# Patient Record
Sex: Female | Born: 1955 | Race: White | Hispanic: No | Marital: Married | Smoking: Current every day smoker
Health system: Southern US, Community
[De-identification: ages and names within clinical notes are randomized; demographics above are authoritative.]

## PROBLEM LIST (undated history)

## (undated) DIAGNOSIS — K589 Irritable bowel syndrome without diarrhea: Secondary | ICD-10-CM

## (undated) DIAGNOSIS — J45909 Unspecified asthma, uncomplicated: Secondary | ICD-10-CM

## (undated) DIAGNOSIS — E785 Hyperlipidemia, unspecified: Secondary | ICD-10-CM

## (undated) DIAGNOSIS — K5909 Other constipation: Secondary | ICD-10-CM

## (undated) DIAGNOSIS — Z8711 Personal history of peptic ulcer disease: Secondary | ICD-10-CM

## (undated) DIAGNOSIS — R7303 Prediabetes: Secondary | ICD-10-CM

## (undated) DIAGNOSIS — K279 Peptic ulcer, site unspecified, unspecified as acute or chronic, without hemorrhage or perforation: Secondary | ICD-10-CM

## (undated) DIAGNOSIS — E7800 Pure hypercholesterolemia, unspecified: Secondary | ICD-10-CM

## (undated) DIAGNOSIS — B977 Papillomavirus as the cause of diseases classified elsewhere: Secondary | ICD-10-CM

## (undated) DIAGNOSIS — F419 Anxiety disorder, unspecified: Secondary | ICD-10-CM

## (undated) DIAGNOSIS — N920 Excessive and frequent menstruation with regular cycle: Secondary | ICD-10-CM

## (undated) DIAGNOSIS — J309 Allergic rhinitis, unspecified: Secondary | ICD-10-CM

## (undated) DIAGNOSIS — F17200 Nicotine dependence, unspecified, uncomplicated: Secondary | ICD-10-CM

## (undated) DIAGNOSIS — IMO0002 Reserved for concepts with insufficient information to code with codable children: Secondary | ICD-10-CM

## (undated) DIAGNOSIS — G4700 Insomnia, unspecified: Secondary | ICD-10-CM

## (undated) DIAGNOSIS — M1990 Unspecified osteoarthritis, unspecified site: Secondary | ICD-10-CM

## (undated) DIAGNOSIS — D219 Benign neoplasm of connective and other soft tissue, unspecified: Secondary | ICD-10-CM

## (undated) DIAGNOSIS — J410 Simple chronic bronchitis: Secondary | ICD-10-CM

## (undated) DIAGNOSIS — N946 Dysmenorrhea, unspecified: Secondary | ICD-10-CM

## (undated) DIAGNOSIS — I10 Essential (primary) hypertension: Secondary | ICD-10-CM

## (undated) DIAGNOSIS — D649 Anemia, unspecified: Secondary | ICD-10-CM

## (undated) DIAGNOSIS — K2970 Gastritis, unspecified, without bleeding: Secondary | ICD-10-CM

## (undated) DIAGNOSIS — T8859XA Other complications of anesthesia, initial encounter: Secondary | ICD-10-CM

## (undated) DIAGNOSIS — M810 Age-related osteoporosis without current pathological fracture: Secondary | ICD-10-CM

## (undated) HISTORY — DX: Benign neoplasm of connective and other soft tissue, unspecified: D21.9

## (undated) HISTORY — DX: Nicotine dependence, unspecified, uncomplicated: F17.200

## (undated) HISTORY — DX: Papillomavirus as the cause of diseases classified elsewhere: B97.7

## (undated) HISTORY — DX: Irritable bowel syndrome, unspecified: K58.9

## (undated) HISTORY — DX: Age-related osteoporosis without current pathological fracture: M81.0

## (undated) HISTORY — PX: ROTATOR CUFF REPAIR: SHX139

## (undated) HISTORY — PX: EYE MUSCLE SURGERY: SHX370

## (undated) HISTORY — DX: Reserved for concepts with insufficient information to code with codable children: IMO0002

## (undated) HISTORY — PX: KNEE ARTHROSCOPY W/ MENISCAL REPAIR: SHX1877

## (undated) HISTORY — PX: FOOT SURGERY: SHX648

## (undated) HISTORY — DX: Peptic ulcer, site unspecified, unspecified as acute or chronic, without hemorrhage or perforation: K27.9

## (undated) HISTORY — DX: Gastritis, unspecified, without bleeding: K29.70

## (undated) HISTORY — DX: Insomnia, unspecified: G47.00

## (undated) HISTORY — DX: Pure hypercholesterolemia, unspecified: E78.00

---

## 1989-03-03 HISTORY — PX: FOOT SURGERY: SHX648

## 1993-10-01 DIAGNOSIS — Z8742 Personal history of other diseases of the female genital tract: Secondary | ICD-10-CM

## 1993-10-01 DIAGNOSIS — IMO0002 Reserved for concepts with insufficient information to code with codable children: Secondary | ICD-10-CM

## 1993-10-01 DIAGNOSIS — R87619 Unspecified abnormal cytological findings in specimens from cervix uteri: Secondary | ICD-10-CM

## 1993-10-01 HISTORY — DX: Reserved for concepts with insufficient information to code with codable children: IMO0002

## 1993-10-01 HISTORY — DX: Unspecified abnormal cytological findings in specimens from cervix uteri: R87.619

## 1993-10-01 HISTORY — DX: Personal history of other diseases of the female genital tract: Z87.42

## 1998-07-25 ENCOUNTER — Other Ambulatory Visit: Admission: RE | Admit: 1998-07-25 | Discharge: 1998-07-25 | Payer: Self-pay | Admitting: *Deleted

## 1998-12-17 ENCOUNTER — Encounter (INDEPENDENT_AMBULATORY_CARE_PROVIDER_SITE_OTHER): Payer: Self-pay

## 1998-12-17 ENCOUNTER — Ambulatory Visit (HOSPITAL_COMMUNITY): Admission: RE | Admit: 1998-12-17 | Discharge: 1998-12-17 | Payer: Self-pay | Admitting: Gastroenterology

## 1999-08-12 ENCOUNTER — Other Ambulatory Visit: Admission: RE | Admit: 1999-08-12 | Discharge: 1999-08-12 | Payer: Self-pay | Admitting: *Deleted

## 2000-01-28 ENCOUNTER — Encounter: Payer: Self-pay | Admitting: Neurosurgery

## 2000-01-28 ENCOUNTER — Inpatient Hospital Stay (HOSPITAL_COMMUNITY): Admission: RE | Admit: 2000-01-28 | Discharge: 2000-01-29 | Payer: Self-pay | Admitting: Neurosurgery

## 2000-01-28 HISTORY — PX: CERVICAL DISCECTOMY: SHX98

## 2000-01-28 HISTORY — PX: ANTERIOR CERVICAL DECOMP/DISCECTOMY FUSION: SHX1161

## 2000-03-10 ENCOUNTER — Encounter: Payer: Self-pay | Admitting: Neurosurgery

## 2000-03-10 ENCOUNTER — Encounter: Admission: RE | Admit: 2000-03-10 | Discharge: 2000-03-10 | Payer: Self-pay | Admitting: Neurosurgery

## 2000-08-24 ENCOUNTER — Other Ambulatory Visit: Admission: RE | Admit: 2000-08-24 | Discharge: 2000-08-24 | Payer: Self-pay | Admitting: *Deleted

## 2001-12-17 ENCOUNTER — Other Ambulatory Visit: Admission: RE | Admit: 2001-12-17 | Discharge: 2001-12-17 | Payer: Self-pay | Admitting: *Deleted

## 2002-12-29 ENCOUNTER — Other Ambulatory Visit: Admission: RE | Admit: 2002-12-29 | Discharge: 2002-12-29 | Payer: Self-pay | Admitting: *Deleted

## 2004-01-17 ENCOUNTER — Other Ambulatory Visit: Admission: RE | Admit: 2004-01-17 | Discharge: 2004-01-17 | Payer: Self-pay | Admitting: *Deleted

## 2005-05-06 ENCOUNTER — Other Ambulatory Visit: Admission: RE | Admit: 2005-05-06 | Discharge: 2005-05-06 | Payer: Self-pay | Admitting: Obstetrics and Gynecology

## 2006-05-08 ENCOUNTER — Other Ambulatory Visit: Admission: RE | Admit: 2006-05-08 | Discharge: 2006-05-08 | Payer: Self-pay | Admitting: Obstetrics and Gynecology

## 2007-03-04 HISTORY — PX: SHOULDER ARTHROSCOPY W/ ROTATOR CUFF REPAIR: SHX2400

## 2007-09-24 ENCOUNTER — Other Ambulatory Visit: Admission: RE | Admit: 2007-09-24 | Discharge: 2007-09-24 | Payer: Self-pay | Admitting: Obstetrics and Gynecology

## 2010-03-03 HISTORY — PX: TRIGGER FINGER RELEASE: SHX641

## 2010-04-04 ENCOUNTER — Ambulatory Visit (HOSPITAL_BASED_OUTPATIENT_CLINIC_OR_DEPARTMENT_OTHER)
Admission: RE | Admit: 2010-04-04 | Discharge: 2010-04-04 | Disposition: A | Discharge status: Home / Self Care | Payer: BC Managed Care – PPO | Source: Ambulatory Visit | Attending: Orthopedic Surgery | Admitting: Orthopedic Surgery

## 2010-04-04 DIAGNOSIS — M65839 Other synovitis and tenosynovitis, unspecified forearm: Secondary | ICD-10-CM | POA: Insufficient documentation

## 2010-04-04 HISTORY — PX: TRIGGER FINGER RELEASE: SHX641

## 2010-04-18 NOTE — Op Note (Signed)
  [name], Angela West                 ACCOUNT NO.:  [id number]  MEDICAL RECORD NO.:  [id number]          PATIENT TYPE:  LOCATION:                                 FACILITY:  PHYSICIAN:  Katy Fitch. Necola Bluestein, M.D.      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  OPERATION:  Release of right thumb A1 pulley.  OPERATING SURGEON:  Katy Fitch. Angell Honse, MD  ASSISTANT:  Marveen Reeks Dasnoit, PA-C  ANESTHESIA:  2% lidocaine metacarpal head level block right thumb, this was performed as a minor operating room procedure.  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 Lunette Stands for evaluation and management of a locked right trigger thumb.Dr. Charlett Blake had seen [name] for evaluation of this predicament and tried a steroid injection.  Unfortunately, this was unsuccessful.  Dr. Charlett Blake then referred [name] for hand surgery consult for release of A1 pulley.  After informed consent, she is brought to the operative room at this time anticipating release of the A1 pulley of her right thumb under local anesthesia.  [name] was brought to room 2 of the [facility] and placed in a supine position on the operating table.  After routine surgical time-out, the thumb was prepped with Betadine and 2% lidocaine was infiltrated at the metacarpal head level to obtain a digital block.  After 5 minutes, excellent anesthesia was achieved.  Arm and hand were then prepped with Betadine soap solution, sterilely draped.  On exsanguination of the limb by direct compression, the arterial tourniquet at the proximal brachium was inflated to 220 mmHg. Procedure commenced with a short transverse incision  directly over the palpably thickened A1 pulley.  Subcutaneous tissues were carefully divided taking care to gently identify and retract the radial proper digital nerve.  The pulley was cleared of soft tissues with a Freer followed by placement of Ragnail retractors.  The pulley was split with scalpel scissors.  The tendon was noted have very minor fraying.  After release of the pulley, full active range of motion of the IP joint was recovered.  Bleeding points were not problematic.  The wounds were repaired with two mattress sutures of 5-0 nylon.  [name] was placed in a compressive dressing with sterile gauze and Ace wrap.  She was provided Vicodin 5 mg one p.o. q.4-6 hours p.r.n. pain, 16 tablets without refill.  I will see her back for followup in approximately 1 week for suture removal and initiation of exercise program.     Katy Fitch. Vanilla Heatherington, M.D.     RVS/MEDQ  D:  04/04/2010  T:  04/05/2010  Job:  [id number]  Electronically Signed by Josephine Igo M.D. on 04/18/2010 08:32:03 AM

## 2010-07-19 NOTE — Op Note (Signed)
[facility]  Patient:    Angela West, Angela West                          MRN: [id number] Proc. Date: 01/28/00 Adm. 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  PROCEDURE:  Anterior cervical diskectomy and fusion C5-6 with allograft anterior cervical plate and tong traction.  SURGEON:  Clydene Fake, M.D.  ASSISTANT:  Izell Maeser. Elesa Hacker, M.D.  ANESTHESIA:  General endotracheal anesthesia.  ESTIMATED BLOOD LOSS:  Minimal.  BLOOD GIVEN:  None.  DRAINS:  None.  COMPLICATIONS:  None.  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  MRI was done showing right-sided 5-6 disk herniation compressing the C6 root. Patient brought in for surgery.  PROCEDURE IN DETAIL:  Patient brought in the operating room, general anesthesia was induced.  Patient was placed in Gardner-Wells tong for traction later in the case.  Patient was then prepped and draped in a sterile fashion. Site of incision was injected with 6 cc of 1% lidocaine with epinephrine.  An incision was then made from the midline to the anterior border of the sternocleidomastoid in a skin crease on the left side of the neck.  Incision was taken down through the fat to the platysma and hemostasis obtained with Bovie cauterization.  The platysma was incised with the Bovie and then blunt dissection was taken out through the cervical fascia to the anterior cervical spine.  Needle was placed in the disk space, x-ray obtained showing this was the 5-6 interspace.  The disk space was then incised with  a 15-blade and partial diskectomy performed with pituitary rongeurs.  Longus colli was then reflected laterally on each side using the Bovie and a self-retaining retractor system was then placed.  Diskectomy was then performed using 15-blade and pituitary rongeurs. A Cloward drill guide was placed over the interspace and 10 mm Cloward drill was used to drill through the interspace and endplates leaving a thin rim of posterior cortex.  At this point, the microscope was brought into the field.  Using 1 and 2 mm Kerrison punches, the thin rim of posterior cortex along with the posterior longitudinal ligament was removed.  Then extensive foraminotomies were performed bilaterally using the 1 and 2 mm Kerrison punches and then using the microhook, we were able to remove two pieces of free fragment disk that was out lateral over the C6 root.  After this, there was no pressure on the roots on the right side.  We also made sure there was no pressure on the left foraminotomy.  Wound was irrigated with antibiotic solution and the depth of vertebral body was measured with a depth gauge and a 12 mm bone dowel was cut to be a couple of mm shorter in length.  With traction on the Gardner-Wells tongs, the bone dowel was tapped into place.  We then checked beneath the graft to make sure there was room between the end  of the graft and the dura and there was.  Microscope was removed from the case at this point and an 18 mm anterior cervical plate was placed over the interspace and two screws placed in the C5 and two in the C6. X-ray was obtained showing good position of plate, screws, and bone graft. Wound was irrigated with antibiotic solution, retractor system was removed, hemostasis was obtained with bipolar cauterization and Gelfoam and thrombin. The Gelfoam was then irrigated out and the platysma was closed with 3-0 Vicryl interrupted sutures, the subcutaneous tissues were closed with the same and the  skin closed with Durabond skin glue.  After this was dry, dressing was placed, anterior cervical collar was placed and the patient was then awoken from anesthesia and transferred to recovery room in stable condition. DD:  01/28/00 TD:  01/28/00 Job: [id number]

## 2010-12-02 DIAGNOSIS — D259 Leiomyoma of uterus, unspecified: Secondary | ICD-10-CM

## 2010-12-02 DIAGNOSIS — D219 Benign neoplasm of connective and other soft tissue, unspecified: Secondary | ICD-10-CM

## 2010-12-02 HISTORY — DX: Benign neoplasm of connective and other soft tissue, unspecified: D21.9

## 2010-12-02 HISTORY — DX: Leiomyoma of uterus, unspecified: D25.9

## 2010-12-02 HISTORY — PX: ENDOMETRIAL BIOPSY: SHX622

## 2011-01-21 DIAGNOSIS — F17200 Nicotine dependence, unspecified, uncomplicated: Secondary | ICD-10-CM | POA: Insufficient documentation

## 2012-05-21 ENCOUNTER — Other Ambulatory Visit: Payer: Self-pay | Admitting: Obstetrics & Gynecology

## 2012-08-07 DIAGNOSIS — J45909 Unspecified asthma, uncomplicated: Secondary | ICD-10-CM | POA: Insufficient documentation

## 2013-01-06 ENCOUNTER — Encounter: Payer: Self-pay | Admitting: Nurse Practitioner

## 2013-01-06 ENCOUNTER — Ambulatory Visit (INDEPENDENT_AMBULATORY_CARE_PROVIDER_SITE_OTHER): Payer: BC Managed Care – PPO | Admitting: Nurse Practitioner

## 2013-01-06 VITALS — BP 120/82 | HR 88 | Resp 20 | Ht 60.0 in | Wt 170.0 lb

## 2013-01-06 DIAGNOSIS — Z0000 Encounter for general adult medical examination without abnormal findings: Secondary | ICD-10-CM

## 2013-01-06 DIAGNOSIS — Z01419 Encounter for gynecological examination (general) (routine) without abnormal findings: Secondary | ICD-10-CM

## 2013-01-06 LAB — COMPREHENSIVE METABOLIC PANEL
AST: 21 U/L (ref 0–37)
Albumin: 4.4 g/dL (ref 3.5–5.2)
BUN: 8 mg/dL (ref 6–23)
CO2: 29 mEq/L (ref 19–32)
Calcium: 10.4 mg/dL (ref 8.4–10.5)
Chloride: 106 mEq/L (ref 96–112)
Creat: 0.75 mg/dL (ref 0.50–1.10)
Glucose, Bld: 95 mg/dL (ref 70–99)
Potassium: 5.2 mEq/L (ref 3.5–5.3)
Sodium: 142 mEq/L (ref 135–145)
Total Protein: 7 g/dL (ref 6.0–8.3)

## 2013-01-06 LAB — POCT URINALYSIS DIPSTICK
Bilirubin, UA: NEGATIVE
Glucose, UA: NEGATIVE
Leukocytes, UA: NEGATIVE
Nitrite, UA: NEGATIVE

## 2013-01-06 LAB — TSH: TSH: 1.381 u[IU]/mL (ref 0.350–4.500)

## 2013-01-06 LAB — LIPID PANEL
Cholesterol: 184 mg/dL (ref 0–200)
HDL: 46 mg/dL (ref >39)
Triglycerides: 111 mg/dL (ref <150)

## 2013-01-06 LAB — HEMOGLOBIN, FINGERSTICK: Hemoglobin, fingerstick: 15.7 g/dL (ref 12.0–16.0)

## 2013-01-06 MED ORDER — VITAMIN D (ERGOCALCIFEROL) 1.25 MG (50000 UNIT) PO CAPS: 50000.0000 [IU] | ORAL_CAPSULE | ORAL | Status: DC

## 2013-01-06 NOTE — Patient Instructions (Signed)

## 2013-01-06 NOTE — Progress Notes (Signed)
Patient ID: Angela West, female   DOB: [birth year], 57 y.o.   MRN: [id number] 57 y.o. G8P0A1 Married Caucasian Fe here for annual exam.  Now off Restoril for 4 months and doing well.  No new heath problems.  No LMP recorded. Patient is postmenopausal.          Sexually active: yes  The current method of family planning is post menopausal status.    Exercising: no  The patient does not participate in regular exercise at present. Smoker:  yes  Health Maintenance: Pap:  01/06/12, WNL, neg HR HPV MMG:  01/04/13, normal Colonoscopy:  never BMD:   never TDaP:  ? Labs: HB: 15.7 Urine: negative   reports that she has been smoking.  She has never used smokeless tobacco. She reports that she drinks about 0.5 ounces of alcohol per week. She reports that she does not use illicit drugs.  Past Medical History  Diagnosis Date  . HPV in female   . Smoker   . Abnormal Pap smear 8/95    ASCUS  . IBS (irritable bowel syndrome)   . Hypercholesterolemia   . Fibroids 10/12    numerous  . Insomnia     Past Surgical History  Procedure Laterality Date  . Foot surgery Right   . Cervical discectomy    . Rotator cuff repair Right   . Trigger finger release  2012    right thumb  . Endometrial biopsy  12/2010    negative path    Current Outpatient Prescriptions  Medication Sig Dispense Refill  . ALPRAZolam (XANAX) 1 MG tablet Take 0.5 tablets by mouth as needed.      Marland Kitchen atorvastatin (LIPITOR) 10 MG tablet Take 1 tablet by mouth daily.      . Calcium Carbonate-Vitamin D (CALCIUM + D PO) Take by mouth.      . metoprolol succinate (TOPROL-XL) 25 MG 24 hr tablet Take 1 tablet by mouth daily.      . Multiple Vitamin (MULTIVITAMIN) tablet Take 1 tablet by mouth daily.      . Probiotic Product (PRO-BIOTIC BLEND PO) Take by mouth.      . Vitamin D, Ergocalciferol, (DRISDOL) 50000 UNITS CAPS capsule Take 1 capsule (50,000 Units total) by mouth once a week.  30 capsule  3   No current facility-administered  medications for this visit.    Family History  Problem Relation Age of Onset  . Thyroid disease Mother   . Hypertension Mother   . Hyperlipidemia Mother   . Down syndrome Brother     ROS:  Pertinent items are noted in HPI.  Otherwise, a comprehensive ROS was negative.  Exam:   BP 120/82  Pulse 88  Resp 20  Ht 5' (1.524 m)  Wt 170 lb (77.111 kg)  BMI 33.20 kg/m2 Height: 5' (152.4 cm)  Ht Readings from Last 3 Encounters:  01/06/13 5' (1.524 m)    General appearance: alert, cooperative and appears stated age Head: Normocephalic, without obvious abnormality, atraumatic Neck: no adenopathy, supple, symmetrical, trachea midline and thyroid normal to inspection and palpation Lungs: clear to auscultation bilaterally Breasts: normal appearance, no masses or tenderness Heart: regular rate and rhythm Abdomen: soft, non-tender; no masses,  no organomegaly Extremities: extremities normal, atraumatic, no cyanosis or edema Skin: Skin color, texture, turgor normal. No rashes or lesions Lymph nodes: Cervical, supraclavicular, and axillary nodes normal. No abnormal inguinal nodes palpated Neurologic: Grossly normal   Pelvic: External genitalia:  no lesions  Urethra:  normal appearing urethra with no masses, tenderness or lesions              Bartholin's and Skene's: normal                 Vagina: normal appearing vagina with normal color and discharge, no lesions              Cervix: anteverted              Pap taken: no Bimanual Exam:  Uterus:  normal size, contour, position, consistency, mobility, non-tender              Adnexa: no mass, fullness, tenderness               Rectovaginal: Confirms               Anus:  normal sphincter tone, no lesions  A:  Well Woman with normal exam  Postmenopausal no HRT  Remote history of abnormal pap ASCUS 8/95  History of IBS, left OV cyst, and multiple UTE fibroids  History of PMB 10/12 with negative endo biopsy  Vit D  deficiency   P:   Pap smear as per guidelines   Mammogram due 11/15  Refill on Vit D and will follow with labs  Counseled on breast self exam, adequate intake of calcium and vitamin D, diet and exercise return annually or prn  An After Visit Summary was printed and given to the patient.

## 2013-01-07 LAB — VITAMIN D 25 HYDROXY (VIT D DEFICIENCY, FRACTURES): Vit D, 25-Hydroxy: 53 ng/mL (ref 30–89)

## 2013-01-11 ENCOUNTER — Telehealth: Payer: Self-pay | Admitting: *Deleted

## 2013-01-11 NOTE — Telephone Encounter (Signed)
I have attempted to contact this patient by phone with the following results: left message to return my call on answering machine (home).  

## 2013-01-11 NOTE — Telephone Encounter (Signed)
Message copied by Luisa Dago on Tue Jan 11, 2013 10:46 AM ------      Message from: Ria Comment R      Created: Fri Jan 07, 2013  5:15 PM       Let patient know results.  LDL is slightly up ------

## 2013-01-12 NOTE — Progress Notes (Signed)
Encounter reviewed by Dr. Dannon Nguyenthi Silva.  

## 2013-01-12 NOTE — Telephone Encounter (Signed)
Pt.notified

## 2014-01-02 ENCOUNTER — Encounter: Payer: Self-pay | Admitting: Nurse Practitioner

## 2014-01-30 ENCOUNTER — Ambulatory Visit (INDEPENDENT_AMBULATORY_CARE_PROVIDER_SITE_OTHER): Payer: BC Managed Care – PPO | Admitting: Nurse Practitioner

## 2014-01-30 ENCOUNTER — Encounter: Payer: Self-pay | Admitting: Nurse Practitioner

## 2014-01-30 VITALS — BP 140/80 | HR 78 | Ht 60.5 in | Wt 170.0 lb

## 2014-01-30 DIAGNOSIS — Z1211 Encounter for screening for malignant neoplasm of colon: Secondary | ICD-10-CM

## 2014-01-30 DIAGNOSIS — Z01419 Encounter for gynecological examination (general) (routine) without abnormal findings: Secondary | ICD-10-CM

## 2014-01-30 DIAGNOSIS — E559 Vitamin D deficiency, unspecified: Secondary | ICD-10-CM

## 2014-01-30 DIAGNOSIS — R319 Hematuria, unspecified: Secondary | ICD-10-CM

## 2014-01-30 DIAGNOSIS — Z23 Encounter for immunization: Secondary | ICD-10-CM

## 2014-01-30 MED ORDER — VITAMIN D (ERGOCALCIFEROL) 1.25 MG (50000 UNIT) PO CAPS: 50000.0000 [IU] | ORAL_CAPSULE | ORAL | Status: DC

## 2014-01-30 NOTE — Patient Instructions (Signed)

## 2014-01-30 NOTE — Progress Notes (Signed)
Patient ID: Angela West, female   DOB: [birth year], 58 y.o.   MRN: [id number] 58 y.o. G61P0010 Married Caucasian Fe here for annual exam.  Will get last Vit D level done at [facility] 9/15 and send [place] results. Mother just diagnosed with DCIS at age 46 and had lumpectomy.  Patient's last menstrual period was 12/27/2010.          Sexually active: yes  The current method of family planning is post menopausal status.  Exercising: no The patient does not participate in regular exercise at present. Smoker: yes, 1/2 ppd  Health Maintenance: Pap: 01/06/12, WNL, neg HR HPV MMG: 01/04/13, normal, will schedule for 2015 after discussion at today's visit.  Mother recently diagnosed with breast cancer. Colonoscopy: never BMD: never TDaP: 4/97 Labs:  PCP  Urine:  Trace RBC   reports that she has been smoking.  She has never used smokeless tobacco. She reports that she drinks about 0.5 - 1.0 oz of alcohol per week. She reports that she does not use illicit drugs.  Past Medical History  Diagnosis Date  . HPV in female   . Smoker   . Abnormal Pap smear 8/95    ASCUS  . IBS (irritable bowel syndrome)   . Hypercholesterolemia   . Fibroids 10/12    numerous  . Insomnia     Past Surgical History  Procedure Laterality Date  . Foot surgery Right   . Cervical discectomy    . Rotator cuff repair Right   . Trigger finger release  2012    right thumb  . Endometrial biopsy  12/2010    negative path    Current Outpatient Prescriptions  Medication Sig Dispense Refill  . ALPRAZolam (XANAX) 1 MG tablet Take 0.5 tablets by mouth as needed.    Marland Kitchen atorvastatin (LIPITOR) 10 MG tablet Take 1 tablet by mouth daily.    . Calcium Carbonate-Vitamin D (CALCIUM + D PO) Take by mouth.    . meloxicam (MOBIC) 15 MG tablet Take 1 tablet by mouth daily as needed.    . metoprolol succinate (TOPROL-XL) 25 MG 24 hr tablet Take 2 tablets by mouth daily.     . Multiple Vitamin (MULTIVITAMIN) tablet Take 1 tablet by mouth  daily.    . Probiotic Product (PRO-BIOTIC BLEND PO) Take by mouth.    . Vitamin D, Ergocalciferol, (DRISDOL) 50000 UNITS CAPS capsule Take 1 capsule (50,000 Units total) by mouth once a week. 30 capsule 3   No current facility-administered medications for this visit.    Family History  Problem Relation Age of Onset  . Thyroid disease Mother   . Hypertension Mother   . Hyperlipidemia Mother   . Breast cancer Mother 29  . Down syndrome Brother     ROS:  Pertinent items are noted in HPI.  Otherwise, a comprehensive ROS was negative.  Exam:   BP 140/80 mmHg  Pulse 78  Ht 5' 0.5" (1.537 m)  Wt 170 lb (77.111 kg)  BMI 32.64 kg/m2  LMP 12/27/2010 Height: 5' 0.5" (153.7 cm)  Ht Readings from Last 3 Encounters:  01/30/14 5' 0.5" (1.537 m)  01/06/13 5' (1.524 m)    General appearance: alert, cooperative and appears stated age Head: Normocephalic, without obvious abnormality, atraumatic Neck: no adenopathy, supple, symmetrical, trachea midline and thyroid normal to inspection and palpation Lungs: clear to auscultation bilaterally Breasts: normal appearance, no masses or tenderness Heart: regular rate and rhythm Abdomen: soft, non-tender; no masses,  no organomegaly Extremities: extremities normal,  atraumatic, no cyanosis or edema Skin: Skin color, texture, turgor normal. No rashes or lesions Lymph nodes: Cervical, supraclavicular, and axillary nodes normal. No abnormal inguinal nodes palpated Neurologic: Grossly normal   Pelvic: External genitalia:  no lesions              Urethra:  normal appearing urethra with no masses, tenderness or lesions              Bartholin's and Skene's: normal                 Vagina: normal appearing vagina with normal color and discharge, no lesions              Cervix: anteverted              Pap taken: No. Bimanual Exam:  Uterus:  normal size, contour, position, consistency, mobility, non-tender              Adnexa: no mass, fullness,  tenderness               Rectovaginal: Confirms               Anus:  normal sphincter tone, no lesions  A:  Well Woman with normal exam  Postmenopausal no HRT Remote history of abnormal pap ASCUS 8/95 History of IBS, left OV cyst, and multiple UTE fibroids History of PMB 10/12 with negative endo biopsy Vit D deficiency  Update immunization  Hematuria - asymptomatic   P:   Reviewed health and wellness pertinent to exam  Pap smear not taken today  Mammogram is due and will schedule - after discussion will get 3 D  TDaP today  Refill on Vit d and she will send labs  Follow on urine C&S, micro  Referral to Dr. Collene Mares for screening colonoscopy  Counseled on breast self exam, mammography screening, adequate intake of calcium and vitamin D, diet and exercise, Kegel's exercises return annually or prn  An After Visit Summary was printed and given to the patient.

## 2014-01-30 NOTE — Progress Notes (Signed)
Encounter reviewed by Dr. Brook Silva.  

## 2014-01-31 LAB — URINALYSIS, MICROSCOPIC ONLY
Bacteria, UA: NONE SEEN
CASTS: NONE SEEN
Crystals: NONE SEEN
SQUAMOUS EPITHELIAL / LPF: NONE SEEN

## 2014-02-01 LAB — URINE CULTURE
Colony Count: NO GROWTH
ORGANISM ID, BACTERIA: NO GROWTH

## 2014-02-03 ENCOUNTER — Telehealth: Payer: Self-pay | Admitting: *Deleted

## 2014-02-03 NOTE — Telephone Encounter (Signed)
-----   Message from Milford Cage, [place] sent at 02/02/2014  8:46 AM EST ----- Let patient know that urine culture was negative.

## 2014-02-03 NOTE — Telephone Encounter (Signed)
I have attempted to contact this patient by phone with the following results: left message to return call to [place] at [phone] answering machine (home per [facility]).  No personal information given.  [phone] (Home)  Work number also listed on DPR, but not called.  Phone number did not match number given in demographics.

## 2014-02-06 NOTE — Telephone Encounter (Signed)
Pt informed of results below.

## 2014-02-13 ENCOUNTER — Telehealth: Payer: Self-pay | Admitting: Nurse Practitioner

## 2014-02-13 NOTE — Telephone Encounter (Signed)
Left message for patient to call back. Need to advise that she is scheduled with Dr Collene Mares 01.05.2016 @ 1345.

## 2014-02-15 ENCOUNTER — Telehealth: Payer: Self-pay | Admitting: Nurse Practitioner

## 2014-02-15 NOTE — Telephone Encounter (Signed)
Pt states she is trying to fax her lab results as requested by our office. 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 Instead, they use an email scan to fax system that goes to another persons email. She asked for an email address to send it to, but i notified her that was not an option for our office.   She asked to relay that she didn't think her vitamin D was listed on the labs from the other office.   Please call to discuss  bf

## 2014-02-15 NOTE — Telephone Encounter (Signed)
Advised patient of appt with Dr Collene Mares. Patient will call them directly to reschedule. Provided her with their telephone #.

## 2014-02-16 NOTE — Telephone Encounter (Signed)
Patient does not have vitamin D results. She feels this is what Milford Cage, FNP was waiting for.  Advised can have labs drawn here. Patient requests to wait. She will see her pcp in [place] soon as she would like rx for wellbutrin to help with quitting smoking and will ask them to draw labs. If cannot have vitamin d drawn at pcp will call us back for lab appointment.  Routing to provider for final review. Patient agreeable to disposition. Will close encounter

## 2014-02-28 DIAGNOSIS — E559 Vitamin D deficiency, unspecified: Secondary | ICD-10-CM | POA: Insufficient documentation

## 2014-03-17 LAB — HM COLONOSCOPY: HM COLON: NORMAL

## 2014-06-02 ENCOUNTER — Other Ambulatory Visit: Payer: Self-pay | Admitting: Nurse Practitioner

## 2014-06-05 NOTE — Telephone Encounter (Signed)
Medication refill request: Vitamin D Last AEX:  01/30/14 PG Next AEX: 02/05/15 PG Last MMG (if hormonal medication request): 01/04/13 Normal  Refill authorized: 01/30/14 #30caps/3R.  Patient should have enough refills left till 02/2015

## 2015-02-05 ENCOUNTER — Encounter: Payer: Self-pay | Admitting: Nurse Practitioner

## 2015-02-05 ENCOUNTER — Ambulatory Visit (INDEPENDENT_AMBULATORY_CARE_PROVIDER_SITE_OTHER): Payer: BC Managed Care – PPO | Admitting: Nurse Practitioner

## 2015-02-05 VITALS — BP 108/76 | HR 74 | Ht 60.25 in | Wt 158.0 lb

## 2015-02-05 DIAGNOSIS — E559 Vitamin D deficiency, unspecified: Secondary | ICD-10-CM | POA: Diagnosis not present

## 2015-02-05 DIAGNOSIS — Z0000 Encounter for general adult medical examination without abnormal findings: Secondary | ICD-10-CM | POA: Diagnosis not present

## 2015-02-05 DIAGNOSIS — Z01419 Encounter for gynecological examination (general) (routine) without abnormal findings: Secondary | ICD-10-CM

## 2015-02-05 LAB — POCT URINALYSIS DIPSTICK
BILIRUBIN UA: NEGATIVE
GLUCOSE UA: NEGATIVE
KETONES UA: NEGATIVE
Leukocytes, UA: NEGATIVE
NITRITE UA: NEGATIVE
PH UA: 5
Protein, UA: NEGATIVE
RBC UA: NEGATIVE
Urobilinogen, UA: NEGATIVE

## 2015-02-05 MED ORDER — VITAMIN D (ERGOCALCIFEROL) 1.25 MG (50000 UNIT) PO CAPS: 50000.0000 [IU] | ORAL_CAPSULE | ORAL | Status: DC

## 2015-02-05 NOTE — Patient Instructions (Signed)

## 2015-02-05 NOTE — Progress Notes (Signed)
Patient ID: Angela West, female   DOB: Dec 07, 1955, 59 y.o.   MRN: [id number]  59 y.o. G78P0010 Married  Caucasian Fe here for annual exam.  Recent [facility] including [facility] and was normal.  On [employer] weight loss program and has lost 12 pounds of weight since last year.  Patient's last menstrual period was 12/27/2010.          Sexually active: No.  The current method of family planning is none.    Exercising: Yes.    walking Smoker:  Yes, 1/2 pack per day  Health Maintenance: Pap: 01/06/12, WNL, neg HR HPV MMG:01/10/15, 3D, Bi-Rads 1:  Negative Colonoscopy:03/2014, Normal, repeat in 10 years, Dr. Collene Mares BMD: never TDaP:01/30/14 Labs:PCP   reports that she has been smoking.  She has never used smokeless tobacco. She reports that she drinks about 0.5 - 1.0 oz of alcohol per week. She reports that she does not use illicit drugs.  Past Medical History  Diagnosis Date  . HPV in female   . Smoker   . Abnormal Pap smear 8/95    ASCUS  . IBS (irritable bowel syndrome)   . Hypercholesterolemia   . Fibroids 10/12    numerous  . Insomnia     Past Surgical History  Procedure Laterality Date  . Foot surgery Right   . Cervical discectomy  01/28/2000  . Rotator cuff repair Right 2009 or 2010  . Trigger finger release  2012    right thumb  . Endometrial biopsy  12/2010    negative path    Current Outpatient Prescriptions  Medication Sig Dispense Refill  . ALPRAZolam (XANAX) 1 MG tablet Take 0.5 tablets by mouth as needed.    Marland Kitchen atorvastatin (LIPITOR) 10 MG tablet Take 1 tablet by mouth daily.    Marland Kitchen buPROPion (WELLBUTRIN SR) 150 MG 12 hr tablet Take 1 tablet by mouth daily.  0  . Calcium Carbonate-Vitamin D (CALCIUM + D PO) Take by mouth.    . metoprolol succinate (TOPROL-XL) 25 MG 24 hr tablet Take 2 tablets by mouth daily.     . Multiple Vitamin (MULTIVITAMIN) tablet Take 1 tablet by mouth daily.    . Probiotic Product (PRO-BIOTIC BLEND PO) Take  by mouth.    . Vitamin D, Ergocalciferol, (DRISDOL) 50000 UNITS CAPS capsule Take 1 capsule (50,000 Units total) by mouth once a week. 30 capsule 0   No current facility-administered medications for this visit.    Family History  Problem Relation Age of Onset  . Thyroid disease Mother   . Hypertension Mother   . Hyperlipidemia Mother   . Breast cancer Mother 5  . Down syndrome Brother     ROS:  Pertinent items are noted in HPI.  Otherwise, a comprehensive ROS was negative.  Exam:   BP 108/76 mmHg  Pulse 74  Ht 5' 0.25" (1.53 m)  Wt 158 lb (71.668 kg)  BMI 30.62 kg/m2  LMP 12/27/2010 Height: 5' 0.25" (153 cm) Ht Readings from Last 3 Encounters:  02/05/15 5' 0.25" (1.53 m)  01/30/14 5' 0.5" (1.537 m)  01/06/13 5' (1.524 m)    General appearance: alert, cooperative and appears stated age Head: Normocephalic, without obvious abnormality, atraumatic Neck: no adenopathy, supple, symmetrical, trachea midline and thyroid normal to inspection and palpation Lungs: clear to auscultation bilaterally Breasts: normal appearance, no masses or tenderness Heart: regular rate and rhythm Abdomen: soft, non-tender; no masses,  no organomegaly Extremities: extremities normal, atraumatic, no  cyanosis or edema Skin: Skin color, texture, turgor normal. No rashes or lesions Lymph nodes: Cervical, supraclavicular, and axillary nodes normal. No abnormal inguinal nodes palpated Neurologic: Grossly normal   Pelvic: External genitalia:  no lesions              Urethra:  normal appearing urethra with no masses, tenderness or lesions              Bartholin's and Skene's: normal                 Vagina: normal appearing vagina with normal color and discharge, no lesions              Cervix: anteverted              Pap taken: Yes.   Bimanual Exam:  Uterus:  normal size, contour, position, consistency, mobility, non-tender              Adnexa: no mass, fullness, tenderness                Rectovaginal: Confirms               Anus:  normal sphincter tone, no lesions  Chaperone present: yes  A:  Well Woman with normal exam  Postmenopausal no HRT Remote history of abnormal pap ASCUS 8/95 History of IBS, left OV cyst, and multiple UTE fibroids History of PMB 10/12 with negative endo biopsy Vit D deficiency - now at every 2 weeks   P:   Reviewed health and wellness pertinent to exam  Pap smear as above  Mammogram is due 11/17  Refill Vit D for a year  Will follow with labs  Counseled on breast self exam, mammography screening, adequate intake of calcium and vitamin D, diet and exercise return annually or prn  An After Visit Summary was printed and given to the patient.

## 2015-02-06 LAB — HIV ANTIBODY (ROUTINE TESTING W REFLEX): HIV: NONREACTIVE

## 2015-02-06 LAB — HEPATITIS C ANTIBODY: HCV Ab: NEGATIVE

## 2015-02-07 NOTE — Progress Notes (Signed)
Encounter reviewed Jill Jertson, MD   

## 2015-02-09 LAB — IPS PAP TEST WITH HPV

## 2016-02-06 ENCOUNTER — Encounter: Payer: Self-pay | Admitting: Nurse Practitioner

## 2016-02-06 ENCOUNTER — Ambulatory Visit (INDEPENDENT_AMBULATORY_CARE_PROVIDER_SITE_OTHER): Payer: BC Managed Care – PPO | Admitting: Nurse Practitioner

## 2016-02-06 VITALS — BP 130/80 | HR 64 | Ht 60.0 in | Wt 180.0 lb

## 2016-02-06 DIAGNOSIS — E559 Vitamin D deficiency, unspecified: Secondary | ICD-10-CM | POA: Diagnosis not present

## 2016-02-06 DIAGNOSIS — Z0000 Encounter for general adult medical examination without abnormal findings: Secondary | ICD-10-CM | POA: Diagnosis not present

## 2016-02-06 DIAGNOSIS — Z01419 Encounter for gynecological examination (general) (routine) without abnormal findings: Secondary | ICD-10-CM | POA: Diagnosis not present

## 2016-02-06 MED ORDER — VITAMIN D (ERGOCALCIFEROL) 1.25 MG (50000 UNIT) PO CAPS: 50000.0000 [IU] | ORAL_CAPSULE | ORAL | 2 refills | Status: DC

## 2016-02-06 NOTE — Progress Notes (Signed)
Patient ID: Angela West, female   DOB: 14-Mar-1955, 60 y.o.   MRN: [id number]  60 y.o. G1P0010 Married  Caucasian Fe here for annual exam.  Stopped Nutra system due to GB distress.   No new problems. Now seeing new PCP.  Patient's last menstrual period was 12/27/2010 (exact date).          Sexually active: No.  The current method of family planning is none and post menopausal status.    Exercising: Yes.    walking Smoker:  no  Health Maintenance: Pap:  02/05/15, negative with neg HR HPV MMG:  01/10/15, 3D, Bi-Rads 1:  Negative  Colonoscopy:  03/2014, Normal, repeat in 10 years BMD:   Never TDaP: 01/30/14 Shingles: Not indicated due to age Pneumonia: Not indicated due to age Hep C and HIV: 02/05/15 Labs: PCP takes care of all labs   reports that she has been smoking.  She has never used smokeless tobacco. She reports that she drinks about 0.5 - 1.0 oz of alcohol per week . She reports that she does not use drugs.  Past Medical History:  Diagnosis Date  . Abnormal Pap smear 8/95   ASCUS  . Fibroids 10/12   numerous  . HPV in female   . Hypercholesterolemia   . IBS (irritable bowel syndrome)   . Insomnia   . Smoker     Past Surgical History:  Procedure Laterality Date  . CERVICAL DISCECTOMY  01/28/2000  . ENDOMETRIAL BIOPSY  12/2010   negative path  . FOOT SURGERY Right   . ROTATOR CUFF REPAIR Right 2009 or 2010  . TRIGGER FINGER RELEASE  2012   right thumb    Current Outpatient Prescriptions  Medication Sig Dispense Refill  . atorvastatin (LIPITOR) 10 MG tablet Take 1 tablet by mouth daily.    . Calcium Carbonate-Vitamin D (CALCIUM + D PO) Take by mouth.    . metoprolol succinate (TOPROL-XL) 50 MG 24 hr tablet Take 1 tablet by mouth daily.    . Multiple Vitamin (MULTIVITAMIN) tablet Take 1 tablet by mouth daily.    . Probiotic Product (PRO-BIOTIC BLEND PO) Take by mouth.    . Vitamin D, Ergocalciferol, (DRISDOL) 50000 UNITS CAPS capsule Take 1 capsule (50,000 Units total)  by mouth once a week. 30 capsule 0   No current facility-administered medications for this visit.     Family History  Problem Relation Age of Onset  . Thyroid disease Mother   . Hypertension Mother   . Hyperlipidemia Mother   . Breast cancer Mother 55  . Down syndrome Brother     ROS:  Pertinent items are noted in HPI.  Otherwise, a comprehensive ROS was negative.  Exam:   BP 130/80 (BP Location: Left Arm, Patient Position: Sitting, Cuff Size: Large)   Pulse 64   Ht 5' (1.524 m)   Wt 180 lb (81.6 kg)   LMP 12/27/2010 (Exact Date)   BMI 35.15 kg/m  Height: 5' (152.4 cm) Ht Readings from Last 3 Encounters:  02/06/16 5' (1.524 m)  02/05/15 5' 0.25" (1.53 m)  01/30/14 5' 0.5" (1.537 m)    General appearance: alert, cooperative and appears stated age Head: Normocephalic, without obvious abnormality, atraumatic Neck: no adenopathy, supple, symmetrical, trachea midline and thyroid normal to inspection and palpation Lungs: clear to auscultation bilaterally Breasts: normal appearance, no masses or tenderness Heart: regular rate and rhythm Abdomen: soft, non-tender; no masses,  no organomegaly Extremities: extremities normal, atraumatic, no cyanosis or edema Skin: Skin  color, texture, turgor normal. No rashes or lesions Lymph nodes: Cervical, supraclavicular, and axillary nodes normal. No abnormal inguinal nodes palpated Neurologic: Grossly normal   Pelvic: External genitalia:  no lesions              Urethra:  normal appearing urethra with no masses, tenderness or lesions              Bartholin's and Skene's: normal                 Vagina: normal appearing vagina with normal color and discharge, no lesions              Cervix: anteverted              Pap taken: No. Bimanual Exam:  Uterus:  normal size, contour, position, consistency, mobility, non-tender              Adnexa: no mass, fullness, tenderness               Rectovaginal: Confirms               Anus:  normal  sphincter tone, no lesions  Chaperone present: yes  A:  Well Woman with normal exam  Postmenopausal no HRT Remote history of abnormal pap ASCUS 8/95 History of IBS, left OV cyst, and multiple UTE fibroids History of PMB 10/12 with negative endo biopsy Vit D deficiency - now at every 2 weeks   P:   Reviewed health and wellness pertinent to exam  Pap smear as above  Mammogram is due and will schedule  Refill on Vit D and will follow with labs  Counseled on breast self exam, mammography screening, adequate intake of calcium and vitamin D, diet and exercise, Kegel's exercises return annually or prn  An After Visit Summary was printed and given to the patient.

## 2016-02-06 NOTE — Patient Instructions (Signed)

## 2016-02-07 LAB — VITAMIN D 25 HYDROXY (VIT D DEFICIENCY, FRACTURES): Vit D, 25-Hydroxy: 33 ng/mL (ref 30–100)

## 2016-02-08 ENCOUNTER — Telehealth: Payer: Self-pay

## 2016-02-08 NOTE — Progress Notes (Signed)
Encounter reviewed by Dr. Brook Amundson C. Silva.  

## 2016-02-08 NOTE — Telephone Encounter (Signed)
-----   Message from Graylon Good, [place] sent at 02/08/2016 11:07 AM EST -----   ----- Message ----- From: Kem Boroughs, FNP Sent: 02/08/2016   8:43 AM To: Graylon Good, CMA  Please let pt know that Vit D was lower at 33 compared to last year at 51.  She does miss some doses of her Vit D - encourage compliance. Our goal for her is about 50.

## 2016-02-08 NOTE — Telephone Encounter (Signed)
Left message (per DPR) on vm to call back. -sco

## 2016-02-12 NOTE — Telephone Encounter (Signed)
Left detailed message on vm (per DPR) of lab results. -sco

## 2016-08-22 ENCOUNTER — Encounter: Payer: Self-pay | Admitting: Nurse Practitioner

## 2016-09-26 ENCOUNTER — Telehealth: Payer: Self-pay | Admitting: Obstetrics and Gynecology

## 2016-09-26 NOTE — Telephone Encounter (Signed)
LMTCB/:NP/ .CX/LETTER SENT/RD

## 2017-02-11 ENCOUNTER — Ambulatory Visit: Payer: BC Managed Care – PPO | Admitting: Nurse Practitioner

## 2017-02-19 ENCOUNTER — Ambulatory Visit: Payer: BC Managed Care – PPO | Admitting: Obstetrics and Gynecology

## 2017-02-19 ENCOUNTER — Other Ambulatory Visit: Payer: Self-pay

## 2017-02-19 ENCOUNTER — Encounter: Payer: Self-pay | Admitting: Obstetrics and Gynecology

## 2017-02-19 VITALS — BP 158/80 | HR 84 | Resp 18 | Ht 60.0 in | Wt 182.0 lb

## 2017-02-19 DIAGNOSIS — Z01419 Encounter for gynecological examination (general) (routine) without abnormal findings: Secondary | ICD-10-CM

## 2017-02-19 NOTE — Progress Notes (Signed)
61 y.o. G1P0010 MarriedCaucasianF here for annual exam.  No vaginal bleeding. Not sexually active, h/o ED.  She has gained weight recently. She is only up 2 lbs from last year, but her clothes aren't fitting. She is having issues with her right flexor muscle in her thigh and left ankle.  Normally her BP is fine, stressful driving in the weather.  Retiring after her birthday in May    Patient's last menstrual period was 12/27/2010 (exact date).          Sexually active: No.  The current method of family planning is post menopausal status.    Exercising: No.  The patient does not participate in regular exercise at present. Smoker:  yes  Health Maintenance: Pap:  02-05-15 WNL NEG HR HPV History of abnormal Pap:  no MMG:  07-31-16 WNL  Colonoscopy:  2017 Normal per patient BMD:   Never TDaP:  2015 Gardasil: N/A   reports that she has been smoking.  She has been smoking about 0.50 packs per day. she has never used smokeless tobacco. She reports that she drinks about 1.8 - 2.4 oz of alcohol per week. She reports that she does not use drugs. 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   Past Medical History:  Diagnosis Date  . Abnormal Pap smear 8/95   ASCUS  . Fibroids 10/12   numerous  . HPV in female   . Hypercholesterolemia   . IBS (irritable bowel syndrome)   . Insomnia   . Smoker     Past Surgical History:  Procedure Laterality Date  . CERVICAL DISCECTOMY  01/28/2000  . ENDOMETRIAL BIOPSY  12/2010   negative path  . FOOT SURGERY Right   . ROTATOR CUFF REPAIR Right 2009 or 2010  . TRIGGER FINGER RELEASE  2012   right thumb    Current Outpatient Medications  Medication Sig Dispense Refill  . atorvastatin (LIPITOR) 10 MG tablet Take 1 tablet by mouth daily.    . Calcium Carbonate-Vitamin D (CALCIUM + D PO) Take by mouth.    . metoprolol succinate (TOPROL-XL) 50 MG 24 hr tablet Take 1 tablet by mouth daily.    . Multiple Vitamin (MULTIVITAMIN) tablet Take 1 tablet  by mouth daily.    . Vitamin D, Ergocalciferol, (DRISDOL) 50000 units CAPS capsule Take 1 capsule (50,000 Units total) by mouth once a week. 30 capsule 2   No current facility-administered medications for this visit.   she is on the toprol for a "rapid heart rate". States not on meds for BP  Family History  Problem Relation Age of Onset  . Thyroid disease Mother   . Hypertension Mother   . Hyperlipidemia Mother   . Breast cancer Mother 84  . Down syndrome Brother     Review of Systems  Constitutional: Negative.   HENT: Negative.   Eyes: Negative.   Respiratory: Negative.   Cardiovascular: Negative.   Gastrointestinal: Negative.   Endocrine: Negative.   Genitourinary: Negative.   Musculoskeletal: Negative.   Skin: Negative.   Allergic/Immunologic: Negative.   Neurological: Negative.   Psychiatric/Behavioral: Negative.     Exam:   BP (!) 158/80 (BP Location: Right Arm, Patient Position: Sitting, Cuff Size: Normal)   Pulse 84   Resp 18   Ht 5' (1.524 m)   Wt 182 lb (82.6 kg)   LMP 12/27/2010 (Exact Date)   BMI 35.54 kg/m   Weight change: @WEIGHTCHANGE @ Height:   Height: 5' (152.4 cm)  Ht Readings  from Last 3 Encounters:  02/19/17 5' (1.524 m)  02/06/16 5' (1.524 m)  02/05/15 5' 0.25" (1.53 m)    General appearance: alert, cooperative and appears stated age Head: Normocephalic, without obvious abnormality, atraumatic Neck: no adenopathy, supple, symmetrical, trachea midline and thyroid normal to inspection and palpation Lungs: clear to auscultation bilaterally Cardiovascular: regular rate and rhythm Breasts: normal appearance, no masses or tenderness Abdomen: soft, non-tender; non distended,  no masses,  no organomegaly Extremities: extremities normal, atraumatic, no cyanosis or edema Skin: Skin color, texture, turgor normal. No rashes or lesions Lymph nodes: Cervical, supraclavicular, and axillary nodes normal. No abnormal inguinal nodes palpated Neurologic:  Grossly normal   Pelvic: External genitalia:  no lesions              Urethra:  normal appearing urethra with no masses, tenderness or lesions              Bartholins and Skenes: normal                 Vagina: normal appearing vagina with normal color and discharge, no lesions              Cervix: no lesions               Bimanual Exam:  Uterus:  normal size, contour, position, consistency, mobility, non-tender              Adnexa: no mass, fullness, tenderness               Rectovaginal: Confirms               Anus:  normal sphincter tone, no lesions  Chaperone was present for exam.  A:  Well Woman with normal exam  Overweight, discussed weight loss  P:   Labs with primary  Pap next year  Mammogram and colonoscopy UTD  Discussed breast self exam  Discussed calcium and vit D intake  She reports normal BP, today under stress

## 2017-02-19 NOTE — Patient Instructions (Signed)

## 2017-12-11 ENCOUNTER — Emergency Department (HOSPITAL_BASED_OUTPATIENT_CLINIC_OR_DEPARTMENT_OTHER)
Admission: EM | Admit: 2017-12-11 | Discharge: 2017-12-11 | Disposition: A | Discharge status: Home / Self Care | Payer: BC Managed Care – PPO | Attending: Emergency Medicine | Admitting: Emergency Medicine

## 2017-12-11 ENCOUNTER — Emergency Department (HOSPITAL_BASED_OUTPATIENT_CLINIC_OR_DEPARTMENT_OTHER): Payer: BC Managed Care – PPO

## 2017-12-11 ENCOUNTER — Encounter (HOSPITAL_BASED_OUTPATIENT_CLINIC_OR_DEPARTMENT_OTHER): Payer: Self-pay | Admitting: *Deleted

## 2017-12-11 ENCOUNTER — Other Ambulatory Visit: Payer: Self-pay

## 2017-12-11 DIAGNOSIS — F17210 Nicotine dependence, cigarettes, uncomplicated: Secondary | ICD-10-CM | POA: Insufficient documentation

## 2017-12-11 DIAGNOSIS — I10 Essential (primary) hypertension: Secondary | ICD-10-CM | POA: Insufficient documentation

## 2017-12-11 DIAGNOSIS — Z79899 Other long term (current) drug therapy: Secondary | ICD-10-CM | POA: Diagnosis not present

## 2017-12-11 DIAGNOSIS — R079 Chest pain, unspecified: Secondary | ICD-10-CM | POA: Diagnosis not present

## 2017-12-11 HISTORY — DX: Essential (primary) hypertension: I10

## 2017-12-11 LAB — CBC WITH DIFFERENTIAL/PLATELET
Abs Immature Granulocytes: 0.02 10*3/uL (ref 0.00–0.07)
Basophils Absolute: 0 10*3/uL (ref 0.0–0.1)
Basophils Relative: 0 %
Eosinophils Absolute: 0.1 10*3/uL (ref 0.0–0.5)
Eosinophils Relative: 1 %
HEMATOCRIT: 43.4 % (ref 36.0–46.0)
HEMOGLOBIN: 14.9 g/dL (ref 12.0–15.0)
Immature Granulocytes: 0 %
LYMPHS ABS: 2 10*3/uL (ref 0.7–4.0)
LYMPHS PCT: 25 %
MCH: 32.6 pg (ref 26.0–34.0)
MCHC: 34.3 g/dL (ref 30.0–36.0)
MCV: 95 fL (ref 80.0–100.0)
MONO ABS: 0.6 10*3/uL (ref 0.1–1.0)
MONOS PCT: 8 %
NRBC: 0 % (ref 0.0–0.2)
Neutro Abs: 5.1 10*3/uL (ref 1.7–7.7)
Neutrophils Relative %: 66 %
Platelets: 270 10*3/uL (ref 150–400)
RBC: 4.57 MIL/uL (ref 3.87–5.11)
RDW: 12.7 % (ref 11.5–15.5)
WBC: 7.8 10*3/uL (ref 4.0–10.5)

## 2017-12-11 LAB — BASIC METABOLIC PANEL
Anion gap: 8 (ref 5–15)
BUN: 9 mg/dL (ref 6–20)
CHLORIDE: 105 mmol/L (ref 98–111)
CO2: 25 mmol/L (ref 22–32)
CREATININE: 0.63 mg/dL (ref 0.44–1.00)
Calcium: 9 mg/dL (ref 8.9–10.3)
GFR calc non Af Amer: >60 mL/min (ref >60)
GLUCOSE: 115 mg/dL — AB (ref 70–99)
Potassium: 4 mmol/L (ref 3.5–5.1)
Sodium: 138 mmol/L (ref 135–145)

## 2017-12-11 LAB — TROPONIN I: Troponin I: <0.03 ng/mL (ref <0.03)

## 2017-12-11 NOTE — ED Triage Notes (Signed)
Pt reports heaviness to her chest yesterday after her ceiling fell in due to water leak, also feeling like her bp was high. Seen by her pcp this am, and sent here for further eval. Pt denies any chest heaviness at this time, when asked what is bothering her at this time pt states "My right hand is tingly." denies any sob, but reports nausea at times. Pt states her pcp started her on two hypertension meds today.

## 2017-12-11 NOTE — ED Notes (Signed)
Patient transported to X-ray 

## 2017-12-11 NOTE — Discharge Instructions (Addendum)
Follow-up with cardiology if symptoms persist.  The contact information for the [facility] cardiology clinic has been provided in this discharge summary for you to call and make these arrangements.  Return to the emergency department if symptoms worsen or change in the meantime.

## 2017-12-11 NOTE — ED Provider Notes (Signed)
[place] EMERGENCY DEPARTMENT Provider Note   CSN: [id number] Arrival date & time: 12/11/17  1018     History   Chief Complaint Chief Complaint  Patient presents with  . Chest Pain    HPI Angela West is a 62 y.o. female.  This patient is a 62 year old female with past medical history of hypertension, hypercholesterolemia, and tobacco use.  She presents today for evaluation of chest discomfort.  This started yesterday while she was cleaning out her room in her house.  She describes a feeling of heaviness to the front of her chest with associated numbness of her right hand and nausea.  She denies to me that she experienced any difficulty breathing or diaphoresis.  This episode lasted for approximately 3 to 4 hours and did not respond to taking a Xanax and an antihistamine.  She woke this morning feeling better, but called her doctor to tell her what happened.  She was then sent here for further evaluation.  She denies any recent exertional symptoms.  She does tell me that she has seen a cardiologist in the past, however this was many years ago and there was no significant issue identified.  The history is provided by the patient.  Chest Pain   This is a new problem. The current episode started yesterday. The problem occurs constantly. The problem has been resolved. The pain is present in the substernal region. The pain is mild. The quality of the pain is described as heavy. The pain radiates to the right arm. Pertinent negatives include no cough and no shortness of breath. She has tried nothing for the symptoms.    Past Medical History:  Diagnosis Date  . Abnormal Pap smear 8/95   ASCUS  . Fibroids 10/12   numerous  . HPV in female   . Hypercholesterolemia   . Hypertension   . IBS (irritable bowel syndrome)   . Insomnia   . Smoker     There are no active problems to display for this patient.   Past Surgical History:  Procedure Laterality Date  . CERVICAL  DISCECTOMY  01/28/2000  . ENDOMETRIAL BIOPSY  12/2010   negative path  . FOOT SURGERY Right   . ROTATOR CUFF REPAIR Right 2009 or 2010  . TRIGGER FINGER RELEASE  2012   right thumb     OB History    Gravida  1   Para  0   Term  0   Preterm  0   AB  1   Living  0     SAB  0   TAB  0   Ectopic  0   Multiple  0   Live Births  0            Home Medications    Prior to Admission medications   Medication Sig Start Date End Date Taking? Authorizing Provider  losartan (COZAAR) 50 MG tablet Take 50 mg by mouth daily.   Yes [provider]  meloxicam (MOBIC) 7.5 MG tablet Take 7.5 mg by mouth daily.   Yes [provider]  atorvastatin (LIPITOR) 10 MG tablet Take 1 tablet by mouth daily. 10/29/12   [provider]  Calcium Carbonate-Vitamin D (CALCIUM + D PO) Take by mouth.    [provider]  metoprolol succinate (TOPROL-XL) 50 MG 24 hr tablet Take 1 tablet by mouth daily. 12/30/15   [provider]  Multiple Vitamin (MULTIVITAMIN) tablet Take 1 tablet by mouth daily.  [provider]  Vitamin D, Ergocalciferol, (DRISDOL) 50000 units CAPS capsule Take 1 capsule (50,000 Units total) by mouth once a week. 02/06/16   Kem Boroughs, FNP    Family History Family History  Problem Relation Age of Onset  . Thyroid disease Mother   . Hypertension Mother   . Hyperlipidemia Mother   . Breast cancer Mother 26  . Down syndrome Brother     Social History Social History   Tobacco Use  . Smoking status: Current Every Day Smoker    Packs/day: 0.50  . Smokeless tobacco: Never Used  Substance Use Topics  . Alcohol use: Yes    Alcohol/week: 3.0 - 4.0 standard drinks    Types: 3 - 4 Standard drinks or equivalent per week  . Drug use: No     Allergies   Erythromycin and Penicillins   Review of Systems Review of Systems  Respiratory: Negative for cough and shortness of breath.   Cardiovascular: Positive for  chest pain.  All other systems reviewed and are negative.    Physical Exam Updated Vital Signs BP (!) 162/86 (BP Location: Left Arm)   Pulse 88   Temp 98.3 F (36.8 C) (Oral)   Resp 18   Ht 5' (1.524 m)   Wt 79.4 kg   LMP 12/27/2010 (Exact Date)   SpO2 98%   BMI 34.18 kg/m   Physical Exam  Constitutional: She is oriented to person, place, and time. She appears well-developed and well-nourished. No distress.  HENT:  Head: Normocephalic and atraumatic.  Neck: Normal range of motion. Neck supple.  Cardiovascular: Normal rate and regular rhythm. Exam reveals no gallop and no friction rub.  No murmur heard. Pulmonary/Chest: Effort normal and breath sounds normal. No respiratory distress. She has no wheezes.  Abdominal: Soft. Bowel sounds are normal. She exhibits no distension. There is no tenderness.  Musculoskeletal: Normal range of motion.       Right lower leg: Normal. She exhibits no tenderness and no edema.       Left lower leg: Normal. She exhibits no tenderness and no edema.  Neurological: She is alert and oriented to person, place, and time.  Skin: Skin is warm and dry. She is not diaphoretic.  Nursing note and vitals reviewed.    ED Treatments / Results  Labs (all labs ordered are listed, but only abnormal results are displayed) Labs Reviewed - No data to display  EKG EKG Interpretation  Date/Time:  Friday December 11 2017 10:26:47 EDT Ventricular Rate:  84 PR Interval:    QRS Duration: 74 QT Interval:  361 QTC Calculation: 427 R Axis:   -2 Text Interpretation:  Sinus rhythm Low voltage, precordial leads Baseline wander in lead(s) II aVF V6 Confirmed by Veryl Speak [phone]) on 12/11/2017 10:44:27 AM   Radiology No results found.  Procedures Procedures (including critical care time)  Medications Ordered in ED Medications - No data to display   Initial Impression / Assessment and Plan / ED Course  I have reviewed the triage vital signs and the  nursing notes.  Pertinent labs & imaging results that were available during my care of the patient were reviewed by me and considered in my medical decision making (see chart for details).  Patient's work-up reveals a negative troponin and unchanged EKG.  At this point, I see no indication for admission.  I will have the patient follow-up as an outpatient with cardiology to discuss the possibility of a stress test.  To return if  her symptoms worsen or change.  Final Clinical Impressions(s) / ED Diagnoses   Final diagnoses:  None    ED Discharge Orders    None       Veryl Speak, MD 12/11/17 1252

## 2017-12-22 DIAGNOSIS — R12 Heartburn: Secondary | ICD-10-CM | POA: Insufficient documentation

## 2017-12-22 DIAGNOSIS — M940 Chondrocostal junction syndrome [Tietze]: Secondary | ICD-10-CM | POA: Insufficient documentation

## 2017-12-22 DIAGNOSIS — R1013 Epigastric pain: Secondary | ICD-10-CM | POA: Insufficient documentation

## 2018-01-31 DIAGNOSIS — Z8719 Personal history of other diseases of the digestive system: Secondary | ICD-10-CM

## 2018-01-31 HISTORY — DX: Personal history of other diseases of the digestive system: Z87.19

## 2018-02-09 DIAGNOSIS — R0789 Other chest pain: Secondary | ICD-10-CM | POA: Insufficient documentation

## 2018-02-15 ENCOUNTER — Encounter: Payer: Self-pay | Admitting: Obstetrics & Gynecology

## 2018-02-15 HISTORY — PX: ESOPHAGOGASTRODUODENOSCOPY: SHX1529

## 2018-03-23 NOTE — Progress Notes (Signed)
63 y.o. G44P0010 Married White or Caucasian Not Hispanic or Latino female here for annual exam.   Not sexually active, husband with ED. Retired in May. Husband diagnosed with emphysema this summer. Brother with Cherlyn Cushing and dementia (in his 67's), lives with her parents (18's). She has been under a lot of stress. Helps take care of her brother on the weekend. She had chest pain in the fall, diagnosed with gastritis. Diagnosed with HTN this year. She has bulging disc's in her neck, having shoulder pain. Also pain in her lower back and down her leg (lower spinal issues). Can't walk without a limp. No vaginal bleeding. Not sexually active secondary to ED. IBS is tolerable.  She has not leaked urine, occasionally feels like she might leak on the way to the bathroom     Patient's last menstrual period was 12/27/2010 (exact date).          Sexually active: No.  The current method of family planning is post menopausal status.    Exercising: No.  The patient does not participate in regular exercise at present. Smoker:  Yes, 1/2 PPD, plans to quit.   Health Maintenance: Pap:  02-05-15 WNL NEG HR HPV History of abnormal Pap:  no MMG:  01/08/2018 Birads 1 negative Colonoscopy:  2017 Normal per patient, 10 year plan BMD:   Never TDaP:  2015 Gardasil: N/A   reports that she has been smoking cigarettes. She has been smoking about 0.50 packs per day. She has never used smokeless tobacco. She reports current alcohol use of about 3.0 - 4.0 standard drinks of alcohol per week. She reports that she does not use drugs.  Past Medical History:  Diagnosis Date  . Abnormal Pap smear 8/95   ASCUS  . Fibroids 10/12   numerous  . Gastritis   . HPV in female   . Hypercholesterolemia   . Hypertension   . IBS (irritable bowel syndrome)   . Insomnia   . Peptic ulcer   . Smoker     Past Surgical History:  Procedure Laterality Date  . CERVICAL DISCECTOMY  01/28/2000  . ENDOMETRIAL BIOPSY  12/2010   negative  path  . FOOT SURGERY Right   . ROTATOR CUFF REPAIR Right 2009 or 2010  . TRIGGER FINGER RELEASE  2012   right thumb    Current Outpatient Medications  Medication Sig Dispense Refill  . Calcium Carbonate-Vitamin D (CALCIUM + D PO) Take by mouth.    . gabapentin (NEURONTIN) 300 MG capsule TK 1 C PO HS PRN    . losartan (COZAAR) 50 MG tablet Take 50 mg by mouth daily.    . meloxicam (MOBIC) 7.5 MG tablet Take 7.5 mg by mouth daily.    . metoprolol succinate (TOPROL-XL) 50 MG 24 hr tablet Take 1 tablet by mouth daily.    . pantoprazole (PROTONIX) 40 MG tablet Take by mouth daily.    . rosuvastatin (CRESTOR) 20 MG tablet Take by mouth daily.    . Vitamin D, Ergocalciferol, (DRISDOL) 50000 units CAPS capsule Take 1 capsule (50,000 Units total) by mouth once a week. 30 capsule 2   No current facility-administered medications for this visit.     Family History  Problem Relation Age of Onset  . Thyroid disease Mother   . Hypertension Mother   . Hyperlipidemia Mother   . Breast cancer Mother 58  . Down syndrome Brother     Review of Systems  Constitutional: Negative.   HENT: Negative.  Eyes: Negative.   Respiratory: Negative.   Cardiovascular: Negative.   Gastrointestinal: Positive for abdominal pain.  Endocrine: Negative.   Genitourinary: Negative.   Musculoskeletal:       Muscle weakness Muscle pain  Skin: Negative.   Allergic/Immunologic: Negative.   Neurological: Negative.   Hematological: Negative.   Psychiatric/Behavioral: Negative.     Exam:   BP 130/78 (BP Location: Right Arm, Patient Position: Sitting, Cuff Size: Large)   Pulse 76   Resp 16   Ht 5' 0.25" (1.53 m)   Wt 187 lb (84.8 kg)   LMP 12/27/2010 (Exact Date)   BMI 36.22 kg/m   Weight change: @WEIGHTCHANGE @ Height:   Height: 5' 0.25" (153 cm)  Ht Readings from Last 3 Encounters:  03/29/18 5' 0.25" (1.53 m)  12/11/17 5' (1.524 m)  02/19/17 5' (1.524 m)    General appearance: alert, cooperative and  appears stated age Head: Normocephalic, without obvious abnormality, atraumatic Neck: no adenopathy, supple, symmetrical, trachea midline and thyroid normal to inspection and palpation Lungs: clear to auscultation bilaterally Cardiovascular: regular rate and rhythm Breasts: normal appearance, no masses or tenderness Abdomen: soft, non-tender; non distended,  no masses,  no organomegaly Extremities: extremities normal, atraumatic, no cyanosis or edema Skin: Skin color, texture, turgor normal. No rashes or lesions Lymph nodes: Cervical, supraclavicular, and axillary nodes normal. No abnormal inguinal nodes palpated Neurologic: Grossly normal   Pelvic: External genitalia:  no lesions              Urethra:  normal appearing urethra with no masses, tenderness or lesions              Bartholins and Skenes: normal                 Vagina: atrophic appearing vagina with normal color and discharge, no lesions              Cervix: no lesions               Bimanual Exam:  Uterus:  normal size, contour, position, consistency, mobility, non-tender              Adnexa: no mass, fullness, tenderness               Rectovaginal: Confirms               Anus:  normal sphincter tone, no lesions  Chaperone was present for exam.  A:  Well Woman with normal exam   P:   Pap with hpv  Labs with primary  Mammogram UTD  Colonoscopy UTD  Discussed breast self exam  Discussed calcium and vit D intake

## 2018-03-29 ENCOUNTER — Other Ambulatory Visit: Payer: Self-pay

## 2018-03-29 ENCOUNTER — Encounter: Payer: Self-pay | Admitting: Obstetrics and Gynecology

## 2018-03-29 ENCOUNTER — Ambulatory Visit: Payer: BC Managed Care – PPO | Admitting: Obstetrics and Gynecology

## 2018-03-29 VITALS — BP 130/78 | HR 76 | Resp 16 | Ht 60.25 in | Wt 187.0 lb

## 2018-03-29 DIAGNOSIS — Z01419 Encounter for gynecological examination (general) (routine) without abnormal findings: Secondary | ICD-10-CM | POA: Diagnosis not present

## 2018-03-29 DIAGNOSIS — Z124 Encounter for screening for malignant neoplasm of cervix: Secondary | ICD-10-CM | POA: Diagnosis not present

## 2018-03-29 NOTE — Patient Instructions (Signed)

## 2018-04-01 ENCOUNTER — Other Ambulatory Visit (HOSPITAL_COMMUNITY)
Admission: RE | Admit: 2018-04-01 | Discharge: 2018-04-01 | Disposition: A | Discharge status: Home / Self Care | Payer: BC Managed Care – PPO | Source: Ambulatory Visit | Attending: Obstetrics and Gynecology | Admitting: Obstetrics and Gynecology

## 2018-04-01 DIAGNOSIS — Z124 Encounter for screening for malignant neoplasm of cervix: Secondary | ICD-10-CM | POA: Diagnosis not present

## 2018-04-01 NOTE — Addendum Note (Signed)
Addended by: Dorothy Spark on: 04/01/2018 05:46 PM   Modules accepted: Orders

## 2018-04-05 LAB — CYTOLOGY - PAP
DIAGNOSIS: NEGATIVE
HPV: NOT DETECTED

## 2019-01-25 ENCOUNTER — Encounter: Payer: Self-pay | Admitting: Obstetrics & Gynecology

## 2019-03-04 HISTORY — PX: KNEE ARTHROSCOPY W/ MENISCAL REPAIR: SHX1877

## 2019-03-31 ENCOUNTER — Ambulatory Visit: Payer: BC Managed Care – PPO | Admitting: Obstetrics and Gynecology

## 2019-04-25 NOTE — Progress Notes (Signed)
64 y.o. G1P0010 Married White or Caucasian Not Hispanic or Latino female here for annual exam. Patient states that she has been having some sweating issues. She sweats when she is active. Occurs when she is trying to get ready. Feels different than her prior hot flashes. This has been going on for the last year. No night sweats, gets slightly warm at night.   Patient states that she had a arthroscopy with meniscal repair about 2 weeks ago.     She has a degenerative disc disease, chronic pain. On Cymbalta and prn gabapentin. Pain well controlled, taking a pain pill at night.   Patient's last menstrual period was 12/27/2010 (exact date).          Sexually active: No. Secondary to ED.  The current method of family planning is post menopausal status.    Exercising: Yes.    Physical Therapy  Smoker:  Yes, 1/2-1 PPD. She would like to quit. Hopes to quit with her husband.   Health Maintenance: Pap:04/01/18 normal Hr HPV Neg   02-05-15 WNL NEG HR HPV History of abnormal Pap:  Yes 26 years ago. F/U was normal.  MMG:  01/25/19 Bi-rad 1 neg  Density B  BMD:   Never  Colonoscopy: 2017 Normal per patient, 10 year plan TDaP:  01/30/14 Gardasil: NA    reports that she has been smoking cigarettes. She has been smoking about 0.50 packs per day. She has never used smokeless tobacco. She reports current alcohol use of about 3.0 - 4.0 standard drinks of alcohol per week. She reports that she does not use drugs. Retired. Brother with Cherlyn Cushing and dementia (in his 53's), lives with her parents (104 and 60). Brother in hospice. Great marriage, together x 26 years.   Past Medical History:  Diagnosis Date  . Abnormal Pap smear 8/95   ASCUS  . Fibroids 10/12   numerous  . Gastritis   . HPV in female   . Hypercholesterolemia   . Hypertension   . IBS (irritable bowel syndrome)   . Insomnia   . Peptic ulcer   . Smoker     Past Surgical History:  Procedure Laterality Date  . CERVICAL DISCECTOMY  01/28/2000   . ENDOMETRIAL BIOPSY  12/2010   negative path  . FOOT SURGERY Right   . KNEE ARTHROSCOPY W/ MENISCAL REPAIR Left   . ROTATOR CUFF REPAIR Right 2009 or 2010  . TRIGGER FINGER RELEASE  2012   right thumb    Current Outpatient Medications  Medication Sig Dispense Refill  . Calcium Carbonate-Vitamin D (CALCIUM + D PO) Take by mouth.    . DULoxetine (CYMBALTA) 30 MG capsule     . gabapentin (NEURONTIN) 300 MG capsule TK 1 C PO HS PRN    . HYDROcodone-acetaminophen (NORCO/VICODIN) 5-325 MG tablet TK 1 T PO Q 6 H PRF PAIN    . losartan (COZAAR) 50 MG tablet Take 50 mg by mouth daily.    . meloxicam (MOBIC) 7.5 MG tablet Take 7.5 mg by mouth daily.    . metoprolol succinate (TOPROL-XL) 50 MG 24 hr tablet Take 1 tablet by mouth daily.    . rosuvastatin (CRESTOR) 20 MG tablet Take by mouth daily.     No current facility-administered medications for this visit.    Family History  Problem Relation Age of Onset  . Thyroid disease Mother   . Hypertension Mother   . Hyperlipidemia Mother   . Breast cancer Mother 57  . Down syndrome Brother  Review of Systems  All other systems reviewed and are negative.   Exam:   BP 132/76   Pulse 80   Temp 98.2 F (36.8 C)   Ht 5' 0.25" (1.53 m)   Wt 182 lb (82.6 kg)   LMP 12/27/2010 (Exact Date)   SpO2 95%   BMI 35.25 kg/m   Weight change: @WEIGHTCHANGE @ Height:   Height: 5' 0.25" (153 cm)  Ht Readings from Last 3 Encounters:  04/27/19 5' 0.25" (1.53 m)  03/29/18 5' 0.25" (1.53 m)  12/11/17 5' (1.524 m)    General appearance: alert, cooperative and appears stated age Head: Normocephalic, without obvious abnormality, atraumatic Neck: no adenopathy, supple, symmetrical, trachea midline and thyroid normal to inspection and palpation Lungs: clear to auscultation bilaterally Cardiovascular: regular rate and rhythm Breasts: normal appearance, no masses or tenderness Abdomen: soft, non-tender; non distended,  no masses,  no  organomegaly Extremities: extremities normal, atraumatic, no cyanosis or edema Skin: Skin color, texture, turgor normal. No rashes or lesions Lymph nodes: Cervical, supraclavicular, and axillary nodes normal. No abnormal inguinal nodes palpated Neurologic: Grossly normal   Pelvic: External genitalia:  no lesions              Urethra:  normal appearing urethra with no masses, tenderness or lesions              Bartholins and Skenes: normal                 Vagina: atrophic appearing vagina with normal color and discharge, no lesions              Cervix: no lesions               Bimanual Exam:  Uterus:  normal size, contour, position, consistency, mobility, non-tender              Adnexa: no mass, fullness, tenderness               Rectovaginal: Confirms               Anus:  normal sphincter tone, no lesions  Karmen Bongo chaperoned for the exam.  A:  Well Woman with normal exam  Smoker, wants to quit  P:   No pap this year  Mammogram and colonoscopy are UTD  Discussed breast self exam  Discussed calcium and vit D intake  Labs with primary

## 2019-04-27 ENCOUNTER — Other Ambulatory Visit: Payer: Self-pay

## 2019-04-27 ENCOUNTER — Ambulatory Visit: Payer: BC Managed Care – PPO | Admitting: Obstetrics and Gynecology

## 2019-04-27 ENCOUNTER — Encounter: Payer: Self-pay | Admitting: Obstetrics and Gynecology

## 2019-04-27 VITALS — BP 132/76 | HR 80 | Temp 98.2°F | Ht 60.25 in | Wt 182.0 lb

## 2019-04-27 DIAGNOSIS — Z01419 Encounter for gynecological examination (general) (routine) without abnormal findings: Secondary | ICD-10-CM | POA: Diagnosis not present

## 2019-04-27 DIAGNOSIS — F17200 Nicotine dependence, unspecified, uncomplicated: Secondary | ICD-10-CM | POA: Diagnosis not present

## 2019-04-27 NOTE — Patient Instructions (Addendum)
EXERCISE AND DIET:  We recommended that you start or continue a regular exercise program for good health. Regular exercise means any activity that makes your heart beat faster and makes you sweat.  We recommend exercising at least 30 minutes per day at least 3 days a week, preferably 4 or 5.  We also recommend a diet low in fat and sugar.  Inactivity, poor dietary choices and obesity can cause diabetes, heart attack, stroke, and kidney damage, among others.    ALCOHOL AND SMOKING:  Women should limit their alcohol intake to no more than 7 drinks/beers/glasses of wine (combined, not each!) per week. Moderation of alcohol intake to this level decreases your risk of breast cancer and liver damage. And of course, no recreational drugs are part of a healthy lifestyle.  And absolutely no smoking or even second hand smoke. Most people know smoking can cause heart and lung diseases, but did you know it also contributes to weakening of your bones? Aging of your skin?  Yellowing of your teeth and nails?  CALCIUM AND VITAMIN D:  Adequate intake of calcium and Vitamin D are recommended.  The recommendations for exact amounts of these supplements seem to change often, but generally speaking 1,200 mg of calcium (between diet and supplement) and 800 units of Vitamin D per day seems prudent. Certain women may benefit from higher intake of Vitamin D.  If you are among these women, your doctor will have told you during your visit.    PAP SMEARS:  Pap smears, to check for cervical cancer or precancers,  have traditionally been done yearly, although recent scientific advances have shown that most women can have pap smears less often.  However, every woman still should have a physical exam from her gynecologist every year. It will include a breast check, inspection of the vulva and vagina to check for abnormal growths or skin changes, a visual exam of the cervix, and then an exam to evaluate the size and shape of the uterus and  ovaries.  And after 62 years of age, a rectal exam is indicated to check for rectal cancers. We will also provide age appropriate advice regarding health maintenance, like when you should have certain vaccines, screening for sexually transmitted diseases, bone density testing, colonoscopy, mammograms, etc.   MAMMOGRAMS:  All women over 40 years old should have a yearly mammogram. Many facilities now offer a "3D" mammogram, which may cost around $50 extra out of pocket. If possible,  we recommend you accept the option to have the 3D mammogram performed.  It both reduces the number of women who will be called back for extra views which then turn out to be normal, and it is better than the routine mammogram at detecting truly abnormal areas.    COLON CANCER SCREENING: Now recommend starting at age 45. At this time colonoscopy is not covered for routine screening until 50. There are take home tests that can be done between 45-49.   COLONOSCOPY:  Colonoscopy to screen for colon cancer is recommended for all women at age 50.  We know, you hate the idea of the prep.  We agree, BUT, having colon cancer and not knowing it is worse!!  Colon cancer so often starts as a polyp that can be seen and removed at colonscopy, which can quite literally save your life!  And if your first colonoscopy is normal and you have no family history of colon cancer, most women don't have to have it again for   10 years.  Once every ten years, you can do something that may end up saving your life, right?  We will be happy to help you get it scheduled when you are ready.  Be sure to check your insurance coverage so you understand how much it will cost.  It may be covered as a preventative service at no cost, but you should check your particular policy.      Breast Self-Awareness Breast self-awareness means being familiar with how your breasts look and feel. It involves checking your breasts regularly and reporting any changes to your  health care provider. Practicing breast self-awareness is important. A change in your breasts can be a sign of a serious medical problem. Being familiar with how your breasts look and feel allows you to find any problems early, when treatment is more likely to be successful. All women should practice breast self-awareness, including women who have had breast implants. How to do a breast self-exam One way to learn what is normal for your breasts and whether your breasts are changing is to do a breast self-exam. To do a breast self-exam: Look for Changes  1. Remove all the clothing above your waist. 2. Stand in front of a mirror in a room with good lighting. 3. Put your hands on your hips. 4. Push your hands firmly downward. 5. Compare your breasts in the mirror. Look for differences between them (asymmetry), such as: ? Differences in shape. ? Differences in size. ? Puckers, dips, and bumps in one breast and not the other. 6. Look at each breast for changes in your skin, such as: ? Redness. ? Scaly areas. 7. Look for changes in your nipples, such as: ? Discharge. ? Bleeding. ? Dimpling. ? Redness. ? A change in position. Feel for Changes Carefully feel your breasts for lumps and changes. It is best to do this while lying on your back on the floor and again while sitting or standing in the shower or tub with soapy water on your skin. Feel each breast in the following way:  Place the arm on the side of the breast you are examining above your head.  Feel your breast with the other hand.  Start in the nipple area and make  inch (2 cm) overlapping circles to feel your breast. Use the pads of your three middle fingers to do this. Apply light pressure, then medium pressure, then firm pressure. The light pressure will allow you to feel the tissue closest to the skin. The medium pressure will allow you to feel the tissue that is a little deeper. The firm pressure will allow you to feel the tissue  close to the ribs.  Continue the overlapping circles, moving downward over the breast until you feel your ribs below your breast.  Move one finger-width toward the center of the body. Continue to use the  inch (2 cm) overlapping circles to feel your breast as you move slowly up toward your collarbone.  Continue the up and down exam using all three pressures until you reach your armpit.  Write Down What You Find  Write down what is normal for each breast and any changes that you find. Keep a written record with breast changes or normal findings for each breast. By writing this information down, you do not need to depend only on memory for size, tenderness, or location. Write down where you are in your menstrual cycle, if you are still menstruating. If you are having trouble noticing differences   in your breasts, do not get discouraged. With time you will become more familiar with the variations in your breasts and more comfortable with the exam. How often should I examine my breasts? Examine your breasts every month. If you are breastfeeding, the best time to examine your breasts is after a feeding or after using a breast pump. If you menstruate, the best time to examine your breasts is 5-7 days after your period is over. During your period, your breasts are lumpier, and it may be more difficult to notice changes. When should I see my health care provider? See your health care provider if you notice:  A change in shape or size of your breasts or nipples.  A change in the skin of your breast or nipples, such as a reddened or scaly area.  Unusual discharge from your nipples.  A lump or thick area that was not there before.  Pain in your breasts.  Anything that concerns you.   Steps to Quit Smoking Smoking tobacco is the leading cause of preventable death. It can affect almost every organ in the body. Smoking puts you and those around you at risk for developing many serious chronic  diseases. Quitting smoking can be difficult, but it is one of the best things that you can do for your health. It is never too late to quit. How do I get ready to quit? When you decide to quit smoking, create a plan to help you succeed. Before you quit:  Pick a date to quit. Set a date within the next 2 weeks to give you time to prepare.  Write down the reasons why you are quitting. Keep this list in places where you will see it often.  Tell your family, friends, and co-workers that you are quitting. Support from your loved ones can make quitting easier.  Talk with your health care provider about your options for quitting smoking.  Find out what treatment options are covered by your health insurance.  Identify people, places, things, and activities that make you want to smoke (triggers). Avoid them. What first steps can I take to quit smoking?  Throw away all cigarettes at home, at work, and in your car.  Throw away smoking accessories, such as ashtrays and lighters.  Clean your car. Make sure to empty the ashtray.  Clean your home, including curtains and carpets. What strategies can I use to quit smoking? Talk with your health care provider about combining strategies, such as taking medicines while you are also receiving in-person counseling. Using these two strategies together makes you more likely to succeed in quitting than if you used either strategy on its own.  If you are pregnant or breastfeeding, talk with your health care provider about finding counseling or other support strategies to quit smoking. Do not take medicine to help you quit smoking unless your health care provider tells you to do so. To quit smoking: Quit right away  Quit smoking completely, instead of gradually reducing how much you smoke over a period of time. Research shows that stopping smoking right away is more successful than gradually quitting.  Attend in-person counseling to help you build  problem-solving skills. You are more likely to succeed in quitting if you attend counseling sessions regularly. Even short sessions of 10 minutes can be effective. Take medicine You may take medicines to help you quit smoking. Some medicines require a prescription and some you can purchase over-the-counter. Medicines may have nicotine in them to replace the   nicotine in cigarettes. Medicines may:  Help to stop cravings.  Help to relieve withdrawal symptoms. Your health care provider may recommend:  Nicotine patches, gum, or lozenges.  Nicotine inhalers or sprays.  Non-nicotine medicine that is taken by mouth. Find resources Find resources and support systems that can help you to quit smoking and remain smoke-free after you quit. These resources are most helpful when you use them often. They include:  Online chats with a counselor.  Telephone quitlines.  Printed self-help materials.  Support groups or group counseling.  Text messaging programs.  Mobile phone apps or applications. Use apps that can help you stick to your quit plan by providing reminders, tips, and encouragement. There are many free apps for mobile devices as well as websites. Examples include Quit Guide from the CDC and smokefree.gov What things can I do to make it easier to quit?   Reach out to your family and friends for support and encouragement. Call telephone quitlines (1-800-QUIT-NOW), reach out to support groups, or work with a counselor for support.  Ask people who smoke to avoid smoking around you.  Avoid places that trigger you to smoke, such as bars, parties, or smoke-break areas at work.  Spend time with people who do not smoke.  Lessen the stress in your life. Stress can be a smoking trigger for some people. To lessen stress, try: ? Exercising regularly. ? Doing deep-breathing exercises. ? Doing yoga. ? Meditating. ? Performing a body scan. This involves closing your eyes, scanning your body from  head to toe, and noticing which parts of your body are particularly tense. Try to relax the muscles in those areas. How will I feel when I quit smoking? Day 1 to 3 weeks Within the first 24 hours of quitting smoking, you may start to feel withdrawal symptoms. These symptoms are usually most noticeable 2-3 days after quitting, but they usually do not last for more than 2-3 weeks. You may experience these symptoms:  Mood swings.  Restlessness, anxiety, or irritability.  Trouble concentrating.  Dizziness.  Strong cravings for sugary foods and nicotine.  Mild weight gain.  Constipation.  Nausea.  Coughing or a sore throat.  Changes in how the medicines that you take for unrelated issues work in your body.  Depression.  Trouble sleeping (insomnia). Week 3 and afterward After the first 2-3 weeks of quitting, you may start to notice more positive results, such as:  Improved sense of smell and taste.  Decreased coughing and sore throat.  Slower heart rate.  Lower blood pressure.  Clearer skin.  The ability to breathe more easily.  Fewer sick days. Quitting smoking can be very challenging. Do not get discouraged if you are not successful the first time. Some people need to make many attempts to quit before they achieve long-term success. Do your best to stick to your quit plan, and talk with your health care provider if you have any questions or concerns. Summary  Smoking tobacco is the leading cause of preventable death. Quitting smoking is one of the best things that you can do for your health.  When you decide to quit smoking, create a plan to help you succeed.  Quit smoking right away, not slowly over a period of time.  When you start quitting, seek help from your health care provider, family, or friends. This information is not intended to replace advice given to you by your health care provider. Make sure you discuss any questions you have with your health   care  provider. Document Revised: 11/12/2018 Document Reviewed: 05/08/2018 Elsevier Patient Education  2020 [employer]  

## 2019-08-10 IMAGING — CR DG CHEST 2V
2 series · 2 of 2 positions shown · non-contrast
Comparison: None.

CLINICAL DATA: Chest pain and nausea

EXAM:
CHEST - 2 VIEW

[w chest pa]
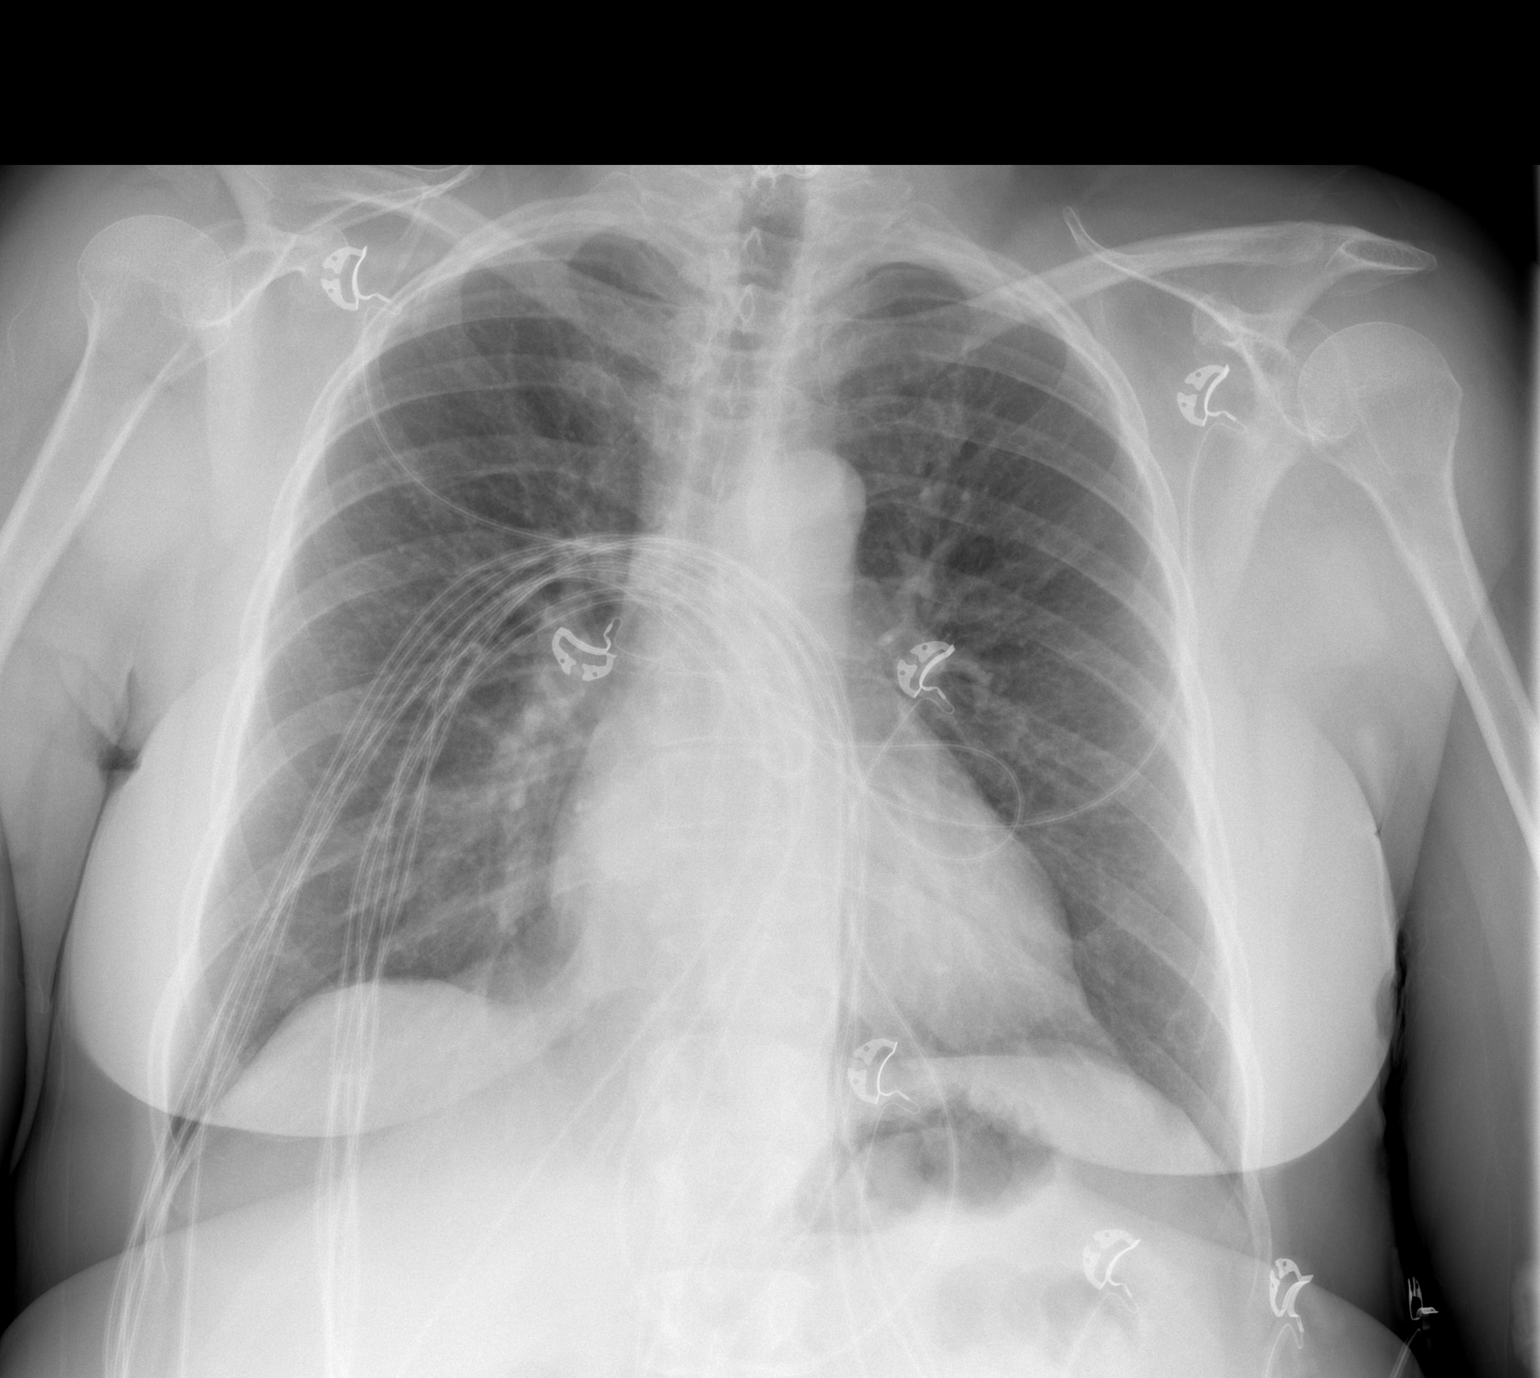

[w chest lat]
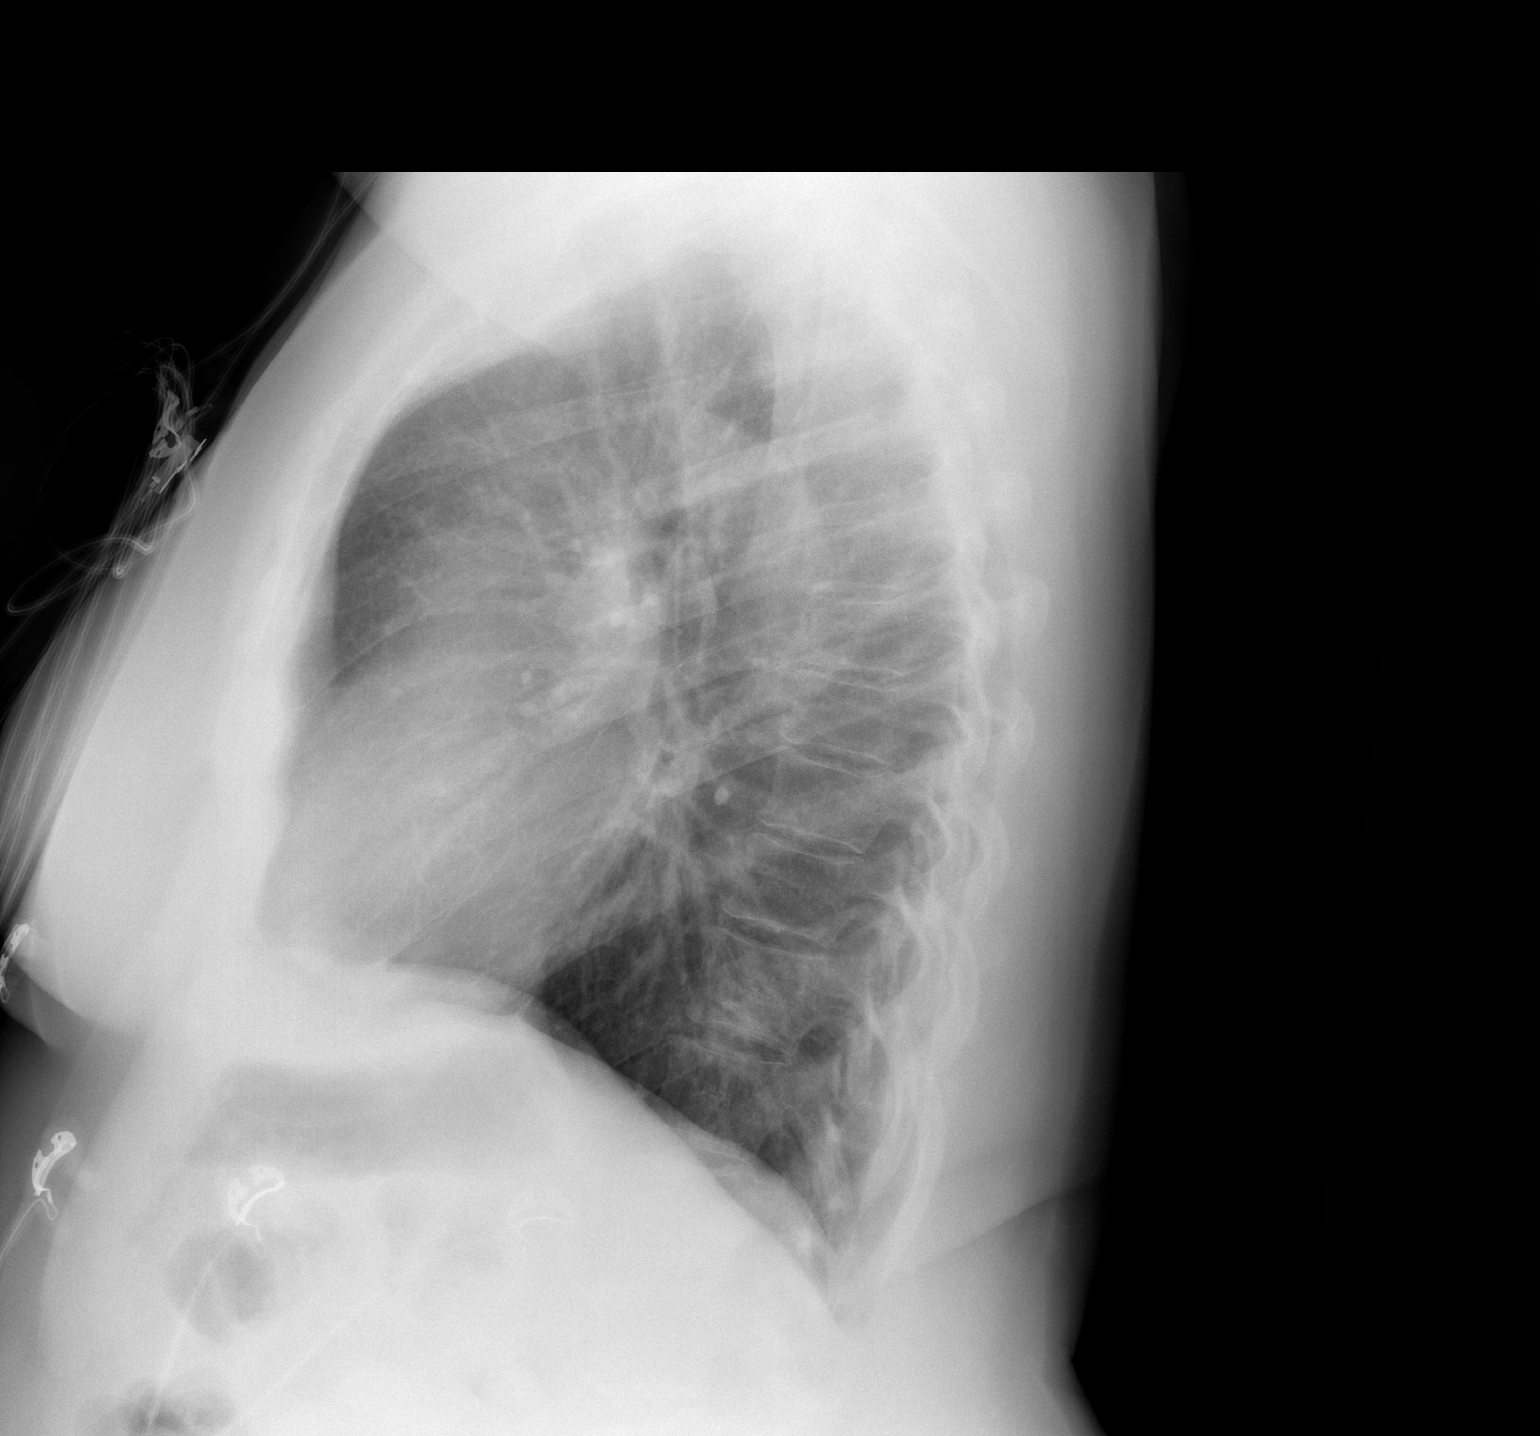

[2 of 2 positions shown; findings below may reference images not displayed]

FINDINGS: Lungs are clear. Heart size and pulmonary vascularity are normal. No
adenopathy. There is postoperative change in the lower cervical
region.
IMPRESSION: No edema or consolidation.

## 2019-08-16 DIAGNOSIS — Z87898 Personal history of other specified conditions: Secondary | ICD-10-CM | POA: Insufficient documentation

## 2019-08-16 DIAGNOSIS — R7303 Prediabetes: Secondary | ICD-10-CM | POA: Insufficient documentation

## 2020-03-03 HISTORY — PX: COLONOSCOPY: SHX174

## 2020-04-26 ENCOUNTER — Encounter: Payer: Self-pay | Admitting: Obstetrics and Gynecology

## 2020-04-30 ENCOUNTER — Encounter: Payer: Self-pay | Admitting: Obstetrics and Gynecology

## 2020-04-30 ENCOUNTER — Other Ambulatory Visit: Payer: Self-pay

## 2020-04-30 ENCOUNTER — Ambulatory Visit: Payer: BC Managed Care – PPO | Admitting: Obstetrics and Gynecology

## 2020-04-30 VITALS — BP 126/68 | HR 96 | Ht 61.0 in | Wt 193.0 lb

## 2020-04-30 DIAGNOSIS — Z01419 Encounter for gynecological examination (general) (routine) without abnormal findings: Secondary | ICD-10-CM

## 2020-04-30 DIAGNOSIS — Z1211 Encounter for screening for malignant neoplasm of colon: Secondary | ICD-10-CM | POA: Insufficient documentation

## 2020-04-30 DIAGNOSIS — F17200 Nicotine dependence, unspecified, uncomplicated: Secondary | ICD-10-CM | POA: Diagnosis not present

## 2020-04-30 DIAGNOSIS — K589 Irritable bowel syndrome without diarrhea: Secondary | ICD-10-CM | POA: Insufficient documentation

## 2020-04-30 NOTE — Patient Instructions (Signed)
EXERCISE   We recommended that you start or continue a regular exercise program for good health. Physical activity is anything that gets your body moving, some is better than none. The CDC recommends 150 minutes per week of Moderate-Intensity Aerobic Activity and 2 or more days of Muscle Strengthening Activity.  Benefits of exercise are limitless: helps weight loss/weight maintenance, improves mood and energy, helps with depression and anxiety, improves sleep, tones and strengthens muscles, improves balance, improves bone density, protects from chronic conditions such as heart disease, high blood pressure and diabetes and so much more. To learn more visit: https://www.cdc.gov/physicalactivity/index.html  DIET: Good nutrition starts with a healthy diet of fruits, vegetables, whole grains, and lean protein sources. Drink plenty of water for hydration. Minimize empty calories, sodium, sweets. For more information about dietary recommendations visit: https://health.gov/our-work/nutrition-physical-activity/dietary-guidelines and https://www.myplate.gov/  ALCOHOL:  Women should limit their alcohol intake to no more than 7 drinks/beers/glasses of wine (combined, not each!) per week. Moderation of alcohol intake to this level decreases your risk of breast cancer and liver damage.  If you are concerned that you may have a problem, or your friends have told you they are concerned about your drinking, there are many resources to help. A well-known program that is free, effective, and available to all people all over the nation is Alcoholics Anonymous.  Check out this site to learn more: https://www.aa.org/   CALCIUM AND VITAMIN D:  Adequate intake of calcium and Vitamin D are recommended for bone health.  You should be getting between 1000-1200 mg of calcium and 800 units of Vitamin D daily between diet and supplements  PAP SMEARS:  Pap smears, to check for cervical cancer or precancers,  have traditionally been  done yearly, scientific advances have shown that most women can have pap smears less often.  However, every woman still should have a physical exam from her gynecologist every year. It will include a breast check, inspection of the vulva and vagina to check for abnormal growths or skin changes, a visual exam of the cervix, and then an exam to evaluate the size and shape of the uterus and ovaries. We will also provide age appropriate advice regarding health maintenance, like when you should have certain vaccines, screening for sexually transmitted diseases, bone density testing, colonoscopy, mammograms, etc.   MAMMOGRAMS:  All women over 40 years old should have a routine mammogram.   COLON CANCER SCREENING: Now recommend starting at age 45. At this time colonoscopy is not covered for routine screening until 50. There are take home tests that can be done between 45-49.   COLONOSCOPY:  Colonoscopy to screen for colon cancer is recommended for all women at age 50.  We know, you hate the idea of the prep.  We agree, BUT, having colon cancer and not knowing it is worse!!  Colon cancer so often starts as a polyp that can be seen and removed at colonscopy, which can quite literally save your life!  And if your first colonoscopy is normal and you have no family history of colon cancer, most women don't have to have it again for 10 years.  Once every ten years, you can do something that may end up saving your life, right?  We will be happy to help you get it scheduled when you are ready.  Be sure to check your insurance coverage so you understand how much it will cost.  It may be covered as a preventative service at no cost, but you should check   your particular policy.      Breast Self-Awareness Breast self-awareness means being familiar with how your breasts look and feel. It involves checking your breasts regularly and reporting any changes to your health care provider. Practicing breast self-awareness is  important. A change in your breasts can be a sign of a serious medical problem. Being familiar with how your breasts look and feel allows you to find any problems early, when treatment is more likely to be successful. All women should practice breast self-awareness, including women who have had breast implants. How to do a breast self-exam One way to learn what is normal for your breasts and whether your breasts are changing is to do a breast self-exam. To do a breast self-exam: Look for Changes  1. Remove all the clothing above your waist. 2. Stand in front of a mirror in a room with good lighting. 3. Put your hands on your hips. 4. Push your hands firmly downward. 5. Compare your breasts in the mirror. Look for differences between them (asymmetry), such as: ? Differences in shape. ? Differences in size. ? Puckers, dips, and bumps in one breast and not the other. 6. Look at each breast for changes in your skin, such as: ? Redness. ? Scaly areas. 7. Look for changes in your nipples, such as: ? Discharge. ? Bleeding. ? Dimpling. ? Redness. ? A change in position. Feel for Changes Carefully feel your breasts for lumps and changes. It is best to do this while lying on your back on the floor and again while sitting or standing in the shower or tub with soapy water on your skin. Feel each breast in the following way:  Place the arm on the side of the breast you are examining above your head.  Feel your breast with the other hand.  Start in the nipple area and make  inch (2 cm) overlapping circles to feel your breast. Use the pads of your three middle fingers to do this. Apply light pressure, then medium pressure, then firm pressure. The light pressure will allow you to feel the tissue closest to the skin. The medium pressure will allow you to feel the tissue that is a little deeper. The firm pressure will allow you to feel the tissue close to the ribs.  Continue the overlapping circles,  moving downward over the breast until you feel your ribs below your breast.  Move one finger-width toward the center of the body. Continue to use the  inch (2 cm) overlapping circles to feel your breast as you move slowly up toward your collarbone.  Continue the up and down exam using all three pressures until you reach your armpit.  Write Down What You Find  Write down what is normal for each breast and any changes that you find. Keep a written record with breast changes or normal findings for each breast. By writing this information down, you do not need to depend only on memory for size, tenderness, or location. Write down where you are in your menstrual cycle, if you are still menstruating. If you are having trouble noticing differences in your breasts, do not get discouraged. With time you will become more familiar with the variations in your breasts and more comfortable with the exam. How often should I examine my breasts? Examine your breasts every month. If you are breastfeeding, the best time to examine your breasts is after a feeding or after using a breast pump. If you menstruate, the best time to   examine your breasts is 5-7 days after your period is over. During your period, your breasts are lumpier, and it may be more difficult to notice changes. When should I see my health care provider? See your health care provider if you notice:  A change in shape or size of your breasts or nipples.  A change in the skin of your breast or nipples, such as a reddened or scaly area.  Unusual discharge from your nipples.  A lump or thick area that was not there before.  Pain in your breasts.  Anything that concerns you.   Steps to Quit Smoking Smoking tobacco is the leading cause of preventable death. It can affect almost every organ in the body. Smoking puts you and those around you at risk for developing many serious chronic diseases. Quitting smoking can be difficult, but it is one of  the best things that you can do for your health. It is never too late to quit. How do I get ready to quit? When you decide to quit smoking, create a plan to help you succeed. Before you quit:  Pick a date to quit. Set a date within the next 2 weeks to give you time to prepare.  Write down the reasons why you are quitting. Keep this list in places where you will see it often.  Tell your family, friends, and co-workers that you are quitting. Support from your loved ones can make quitting easier.  Talk with your health care provider about your options for quitting smoking.  Find out what treatment options are covered by your health insurance.  Identify people, places, things, and activities that make you want to smoke (triggers). Avoid them. What first steps can I take to quit smoking?  Throw away all cigarettes at home, at work, and in your car.  Throw away smoking accessories, such as Scientist, research (medical).  Clean your car. Make sure to empty the ashtray.  Clean your home, including curtains and carpets. What strategies can I use to quit smoking? Talk with your health care provider about combining strategies, such as taking medicines while you are also receiving in-person counseling. Using these two strategies together makes you more likely to succeed in quitting than if you used either strategy on its own.  If you are pregnant or breastfeeding, talk with your health care provider about finding counseling or other support strategies to quit smoking. Do not take medicine to help you quit smoking unless your health care provider tells you to do so. To quit smoking: Quit right away  Quit smoking completely, instead of gradually reducing how much you smoke over a period of time. Research shows that stopping smoking right away is more successful than gradually quitting.  Attend in-person counseling to help you build problem-solving skills. You are more likely to succeed in quitting if you  attend counseling sessions regularly. Even short sessions of 10 minutes can be effective. Take medicine You may take medicines to help you quit smoking. Some medicines require a prescription and some you can purchase over-the-counter. Medicines may have nicotine in them to replace the nicotine in cigarettes. Medicines may:  Help to stop cravings.  Help to relieve withdrawal symptoms. Your health care provider may recommend:  Nicotine patches, gum, or lozenges.  Nicotine inhalers or sprays.  Non-nicotine medicine that is taken by mouth. Find resources Find resources and support systems that can help you to quit smoking and remain smoke-free after you quit. These resources are most helpful  when you use them often. They include:  Online chats with a Social worker.  Telephone quitlines.  Printed Furniture conservator/restorer.  Support groups or group counseling.  Text messaging programs.  Mobile phone apps or applications. Use apps that can help you stick to your quit plan by providing reminders, tips, and encouragement. There are many free apps for mobile devices as well as websites. Examples include Quit Guide from the [employer] and smokefree.gov   What things can I do to make it easier to quit?  Reach out to your family and friends for support and encouragement. Call telephone quitlines (1-800-QUIT-NOW), reach out to support groups, or work with a counselor for support.  Ask people who smoke to avoid smoking around you.  Avoid places that trigger you to smoke, such as bars, parties, or smoke-break areas at work.  Spend time with people who do not smoke.  Lessen the stress in your life. Stress can be a smoking trigger for some people. To lessen stress, try: ? Exercising regularly. ? Doing deep-breathing exercises. ? Doing yoga. ? Meditating. ? Performing a body scan. This involves closing your eyes, scanning your body from head to toe, and noticing which parts of your body are particularly  tense. Try to relax the muscles in those areas.   How will I feel when I quit smoking? Day 1 to 3 weeks Within the first 24 hours of quitting smoking, you may start to feel withdrawal symptoms. These symptoms are usually most noticeable 2-3 days after quitting, but they usually do not last for more than 2-3 weeks. You may experience these symptoms:  Mood swings.  Restlessness, anxiety, or irritability.  Trouble concentrating.  Dizziness.  Strong cravings for sugary foods and nicotine.  Mild weight gain.  Constipation.  Nausea.  Coughing or a sore throat.  Changes in how the medicines that you take for unrelated issues work in your body.  Depression.  Trouble sleeping (insomnia). Week 3 and afterward After the first 2-3 weeks of quitting, you may start to notice more positive results, such as:  Improved sense of smell and taste.  Decreased coughing and sore throat.  Slower heart rate.  Lower blood pressure.  Clearer skin.  The ability to breathe more easily.  Fewer sick days. Quitting smoking can be very challenging. Do not get discouraged if you are not successful the first time. Some people need to make many attempts to quit before they achieve long-term success. Do your best to stick to your quit plan, and talk with your health care provider if you have any questions or concerns. Summary  Smoking tobacco is the leading cause of preventable death. Quitting smoking is one of the best things that you can do for your health.  When you decide to quit smoking, create a plan to help you succeed.  Quit smoking right away, not slowly over a period of time.  When you start quitting, seek help from your health care provider, family, or friends. This information is not intended to replace advice given to you by your health care provider. Make sure you discuss any questions you have with your health care provider. Document Revised: 11/12/2018 Document Reviewed:  05/08/2018 Elsevier Patient Education  [place].

## 2020-04-30 NOTE — Progress Notes (Signed)
65 y.o. G1P0010 Married White or Caucasian Not Hispanic or Latino female here for annual exam.  She is having a lot of sweating and odor in her vaginal area. She lost her Brother in 04-Aug-2022, he died at 41 with Downs and Dementia. Then her Mother died in 04-Feb-2023 from breast cancer.   Dad is 19, living in his own home but has full time care.  She has one living brother.   She has sweating in her groin, can be associated with an odor.     Patient's last menstrual period was 12/27/2010 (exact date).   She has a degenerative disc disease, chronic pain. On Cymbalta and gabapentin.  Also dealing with sciatica.          Sexually active: No.  The current method of family planning is post menopausal status.    Exercising: No.  The patient does not participate in regular exercise at present. Smoker:  Yes 1/2 pack a day  Health Maintenance: Pap: 04/01/18 normal Hr HPV Neg   02-05-15 WNL NEG HR HPV History of abnormal Pap:  Yes 26 years ago f/u was normal  MMG: 01/25/2019 Bi-rads 1 neg  BMD:   Never  Colonoscopy: 2017 Normal per patient 10 year f/u  TDaP:  01/30/14  Gardasil: NA   reports that she has been smoking cigarettes. She has been smoking about 0.50 packs per day. She has never used smokeless tobacco. She reports current alcohol use of about 3.0 - 4.0 standard drinks of alcohol per week. She reports that she does not use drugs. Retired.   Past Medical History:  Diagnosis Date  . Abnormal Pap smear 8/95   ASCUS  . Fibroids 10/12   numerous  . Gastritis   . HPV in female   . Hypercholesterolemia   . Hypertension   . IBS (irritable bowel syndrome)   . Insomnia   . Peptic ulcer   . Smoker     Past Surgical History:  Procedure Laterality Date  . CERVICAL DISCECTOMY  01/28/2000  . ENDOMETRIAL BIOPSY  12/2010   negative path  . FOOT SURGERY Right   . KNEE ARTHROSCOPY W/ MENISCAL REPAIR Left   . ROTATOR CUFF REPAIR Right 2009 or 2010  . TRIGGER FINGER RELEASE  2012   right thumb     Current Outpatient Medications  Medication Sig Dispense Refill  . Calcium Carbonate-Vitamin D (CALCIUM + D PO) Take by mouth.    . DULoxetine (CYMBALTA) 30 MG capsule     . gabapentin (NEURONTIN) 300 MG capsule TK 1 C PO HS PRN    . HYDROcodone-acetaminophen (NORCO/VICODIN) 5-325 MG tablet TK 1 T PO Q 6 H PRF PAIN    . losartan (COZAAR) 50 MG tablet Take 50 mg by mouth daily.    . meloxicam (MOBIC) 7.5 MG tablet Take 7.5 mg by mouth daily.    . metoprolol succinate (TOPROL-XL) 50 MG 24 hr tablet Take 1 tablet by mouth daily.    . rosuvastatin (CRESTOR) 20 MG tablet Take by mouth daily.    . cyclobenzaprine (FLEXERIL) 10 MG tablet Take 10 mg by mouth 3 (three) times daily as needed.     No current facility-administered medications for this visit.    Family History  Problem Relation Age of Onset  . Thyroid disease Mother   . Hypertension Mother   . Hyperlipidemia Mother   . Breast cancer Mother 6  . Down syndrome Brother     Review of Systems  Hematological: Negative for  adenopathy.  All other systems reviewed and are negative.   Exam:   BP 126/68   Pulse 96   Ht 5\' 1"  (1.549 m)   Wt 193 lb (87.5 kg)   LMP 12/27/2010 (Exact Date)   SpO2 98%   BMI 36.47 kg/m   Weight change: @WEIGHTCHANGE @ Height:   Height: 5\' 1"  (154.9 cm)  Ht Readings from Last 3 Encounters:  04/30/20 5\' 1"  (1.549 m)  04/27/19 5' 0.25" (1.53 m)  03/29/18 5' 0.25" (1.53 m)    General appearance: alert, cooperative and appears stated age Head: Normocephalic, without obvious abnormality, atraumatic Neck: no adenopathy, supple, symmetrical, trachea midline and thyroid normal to inspection and palpation Lungs: clear to auscultation bilaterally Cardiovascular: regular rate and rhythm Breasts: normal appearance, no masses or tenderness Abdomen: soft, non-tender; non distended,  no masses,  no organomegaly Extremities: extremities normal, atraumatic, no cyanosis or edema Skin: Skin color, texture,  turgor normal. No rashes or lesions Lymph nodes: Cervical, supraclavicular, and axillary nodes normal. No abnormal inguinal nodes palpated Neurologic: Grossly normal   Pelvic: External genitalia:  no lesions              Urethra:  normal appearing urethra with no masses, tenderness or lesions              Bartholins and Skenes: normal                 Vagina: normal appearing vagina with normal color and discharge, no lesions              Cervix: no lesions               Bimanual Exam:  Uterus:  no masses or tenderness              Adnexa: no mass, fullness, tenderness               Rectovaginal: Confirms               Anus:  normal sphincter tone, no lesions  Gae Dry chaperoned for the exam.  1. Well woman exam No pap this year Mammogram last week at [facility] Colonoscopy is UTD Labs with primary Discussed breast self exam Discussed calcium and vit D intake  2. Smoker Considering quitting, has been a rough year.

## 2021-03-03 HISTORY — PX: CATARACT EXTRACTION W/ INTRAOCULAR LENS IMPLANT: SHX1309

## 2021-03-05 DIAGNOSIS — R110 Nausea: Secondary | ICD-10-CM | POA: Insufficient documentation

## 2021-03-05 DIAGNOSIS — F5101 Primary insomnia: Secondary | ICD-10-CM | POA: Insufficient documentation

## 2021-05-01 NOTE — Progress Notes (Signed)
66 y.o. G1P0010 Married White or Caucasian Not Hispanic or Latino female here for annual exam. No vaginal bleeding, not sexually active for years secondary to ED.  ? ?She is having a vaginal odor, started ~6 months ago. Odor gets up to her groin ? ?She reports occasional mild urge incontinence. She has urgency to void ~1 x a day. Leaks ~3-4 x a week, very small amounts. This is new. Drinks 2 diet sodas a day, then has water the rest of the day.  ? ?Slight constipation at times, she is on daily hydrocodone.  ?  ?She has a degenerative disc disease, chronic pain. On Cymbalta and gabapentin.  ?Continues to have issues with sciatica.  ? ?Her Dad died in 09-Sep-2022 at almost 62.  ? ?Patient's last menstrual period was 12/27/2010 (exact date).          ?Sexually active: No.  ?The current method of family planning is post menopausal status.    ?Exercising: No.  The patient does not participate in regular exercise at present. ?Smoker:  yes 3/4 pack a day  ? ?Health Maintenance: ?Pap:  04/01/18 normal Hr Hpv Neg, 02/05/15 WNL HR HPV Neg.  ?History of abnormal Pap:  yes, 26 years ago f/u was normal  ?MMG:  05/06/21 Bi-rads 1 neg  ?BMD:   never  ?Colonoscopy: 2017 Normal per patient 10 year f/u  ?TDaP:  01/30/14  ?Gardasil: NA ? ? reports that she has been smoking cigarettes. She has been smoking an average of .5 packs per day. She has never used smokeless tobacco. She reports current alcohol use of about 3.0 - 4.0 standard drinks per week. She reports that she does not use drugs. 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 Husband is also retired, they are very close.  ? ?Past Medical History:  ?Diagnosis Date  ? Abnormal Pap smear 8/95  ? ASCUS  ? Fibroids 10/12  ? numerous  ? Gastritis   ? HPV in female   ? Hypercholesterolemia   ? Hypertension   ? IBS (irritable bowel syndrome)   ? Insomnia   ? Peptic ulcer   ? Smoker   ? ? ?Past Surgical History:  ?Procedure Laterality Date  ? CERVICAL DISCECTOMY   01/28/2000  ? ENDOMETRIAL BIOPSY  12/2010  ? negative path  ? EYE MUSCLE SURGERY Bilateral   ? FOOT SURGERY Right   ? KNEE ARTHROSCOPY W/ MENISCAL REPAIR Left   ? ROTATOR CUFF REPAIR Right 2009 or 2010  ? TRIGGER FINGER RELEASE  2012  ? right thumb  ? ? ?Current Outpatient Medications  ?Medication Sig Dispense Refill  ? cyclobenzaprine (FLEXERIL) 10 MG tablet Take 10 mg by mouth 3 (three) times daily as needed.    ? DULoxetine (CYMBALTA) 30 MG capsule     ? gabapentin (NEURONTIN) 300 MG capsule TK 1 C PO HS PRN    ? HYDROcodone-acetaminophen (NORCO/VICODIN) 5-325 MG tablet TK 1 T PO Q 6 H PRF PAIN    ? losartan (COZAAR) 50 MG tablet Take 50 mg by mouth daily.    ? meloxicam (MOBIC) 7.5 MG tablet Take 7.5 mg by mouth daily.    ? metoprolol succinate (TOPROL-XL) 50 MG 24 hr tablet Take 1 tablet by mouth daily.    ? rosuvastatin (CRESTOR) 20 MG tablet Take by mouth daily.    ? ?No current facility-administered medications for this visit.  ? ? ?Family History  ?Problem Relation Age of Onset  ? Thyroid  disease Mother   ? Hypertension Mother   ? Hyperlipidemia Mother   ? Breast cancer Mother 61  ? Down syndrome Brother   ? ? ?Review of Systems  ?All other systems reviewed and are negative. ? ?Exam:   ?BP 132/80   Pulse 83   Ht 5\' 1"  (1.549 m)   Wt 189 lb (85.7 kg)   LMP 12/27/2010 (Exact Date)   SpO2 97%   BMI 35.71 kg/m?   Weight change: @WEIGHTCHANGE @ Height:   Height: 5\' 1"  (154.9 cm)  ?Ht Readings from Last 3 Encounters:  ?05/08/21 5\' 1"  (1.549 m)  ?04/30/20 5\' 1"  (1.549 m)  ?04/27/19 5' 0.25" (1.53 m)  ? ? ?General appearance: alert, cooperative and appears stated age ?Head: Normocephalic, without obvious abnormality, atraumatic ?Neck: no adenopathy, supple, symmetrical, trachea midline and thyroid normal to inspection and palpation ?Lungs: clear to auscultation bilaterally ?Cardiovascular: regular rate and rhythm ?Breasts: normal appearance, no masses or tenderness ?Abdomen: soft, non-tender; non distended,   no masses,  no organomegaly ?Extremities: extremities normal, atraumatic, no cyanosis or edema ?Skin: Skin color, texture, turgor normal. No rashes or lesions ?Lymph nodes: Cervical, supraclavicular, and axillary nodes normal. ?No abnormal inguinal nodes palpated ?Neurologic: Grossly normal ? ? ?Pelvic: External genitalia:  no lesions ?             Urethra:  normal appearing urethra with no masses, tenderness or lesions ?             Bartholins and Skenes: normal    ?             Vagina: atrophic appearing vagina with an increase in yellow vaginal discharge ?             Cervix: no lesions ?              ?Bimanual Exam:  Uterus:   no masses or tenderness ?             Adnexa: no mass, fullness, tenderness ?              Rectovaginal: Confirms ?              Anus:  normal sphincter tone, no lesions ? ?Lovena Le, CMA chaperoned for the exam. ? ?1. Well woman exam ?Discussed breast self exam ?Discussed calcium and vit D intake ?Mammogram and colonoscopy are UTD ?Labs with primary ? ?2. Vaginal odor ?- WET PREP FOR TRICH, YEAST, CLUE: I reviewed the slides she has numerous WBC, WBC to epithelial cells is at least 10 to 1. Negative for trich, clue and yeast. C/W DIV ?- SureSwab? Advanced Vaginitis, TMA ? ?3. Desquamative inflammatory vaginitis ?- clindamycin (CLEOCIN) 2 % vaginal cream; Place 1 Applicatorful vaginally at bedtime. Use nightly for one month  Dispense: 160 g; Refill: 0 ?- SureSwab? Advanced Vaginitis, TMA ?-No STD concerns ?-F/U exam in 5 weeks ? ?4. Urinary urgency ?- Urinalysis,Complete w/RFL Culture ? ?

## 2021-05-06 ENCOUNTER — Encounter: Payer: Self-pay | Admitting: Obstetrics and Gynecology

## 2021-05-08 ENCOUNTER — Encounter: Payer: Self-pay | Admitting: Obstetrics and Gynecology

## 2021-05-08 ENCOUNTER — Ambulatory Visit (INDEPENDENT_AMBULATORY_CARE_PROVIDER_SITE_OTHER): Payer: BC Managed Care – PPO | Admitting: Obstetrics and Gynecology

## 2021-05-08 ENCOUNTER — Other Ambulatory Visit: Payer: Self-pay

## 2021-05-08 VITALS — BP 132/80 | HR 83 | Ht 61.0 in | Wt 189.0 lb

## 2021-05-08 DIAGNOSIS — R3915 Urgency of urination: Secondary | ICD-10-CM

## 2021-05-08 DIAGNOSIS — N898 Other specified noninflammatory disorders of vagina: Secondary | ICD-10-CM

## 2021-05-08 DIAGNOSIS — N761 Subacute and chronic vaginitis: Secondary | ICD-10-CM

## 2021-05-08 DIAGNOSIS — Z01419 Encounter for gynecological examination (general) (routine) without abnormal findings: Secondary | ICD-10-CM

## 2021-05-08 LAB — WET PREP FOR TRICH, YEAST, CLUE

## 2021-05-08 MED ORDER — CLINDAMYCIN PHOSPHATE 2 % VA CREA: 1.0000 | TOPICAL_CREAM | Freq: Every day | VAGINAL | 0 refills | Status: DC

## 2021-05-08 NOTE — Patient Instructions (Addendum)
Try replense vaginal moisturizer ? ? ?EXERCISE   We recommended that you start or continue a regular exercise program for good health. Physical activity is anything that gets your body moving, some is better than none. The CDC recommends 150 minutes per week of Moderate-Intensity Aerobic Activity and 2 or more days of Muscle Strengthening Activity. ? ?Benefits of exercise are limitless: helps weight loss/weight maintenance, improves mood and energy, helps with depression and anxiety, improves sleep, tones and strengthens muscles, improves balance, improves bone density, protects from chronic conditions such as heart disease, high blood pressure and diabetes and so much more. ?To learn more visit: WhyNotPoker.uy ? ?DIET: Good nutrition starts with a healthy diet of fruits, vegetables, whole grains, and lean protein sources. Drink plenty of water for hydration. Minimize empty calories, sodium, sweets. For more information about dietary recommendations visit: GeekRegister.com.ee and http://schaefer-mitchell.com/ ? ?ALCOHOL:  Women should limit their alcohol intake to no more than 7 drinks/beers/glasses of wine (combined, not each!) per week. Moderation of alcohol intake to this level decreases your risk of breast cancer and liver damage.  If you are concerned that you may have a problem, or your friends have told you they are concerned about your drinking, there are many resources to help. A well-known program that is free, effective, and available to all people all over the nation is Alcoholics Anonymous.  Check out this site to learn more: BlockTaxes.se ? ? ?CALCIUM AND VITAMIN D:  Adequate intake of calcium and Vitamin D are recommended for bone health.  You should be getting between 1000-1200 mg of calcium and 800 units of Vitamin D daily between diet and supplements ? ?PAP SMEARS:  Pap smears, to check for cervical cancer or  precancers,  have traditionally been done yearly, scientific advances have shown that most women can have pap smears less often.  However, every woman still should have a physical exam from her gynecologist every year. It will include a breast check, inspection of the vulva and vagina to check for abnormal growths or skin changes, a visual exam of the cervix, and then an exam to evaluate the size and shape of the uterus and ovaries. We will also provide age appropriate advice regarding health maintenance, like when you should have certain vaccines, screening for sexually transmitted diseases, bone density testing, colonoscopy, mammograms, etc.  ? ?MAMMOGRAMS:  All women over 46 years old should have a routine mammogram.  ? ?COLON CANCER SCREENING: Now recommend starting at age 69. At this time colonoscopy is not covered for routine screening until 50. There are take home tests that can be done between 45-49.  ? ?COLONOSCOPY:  Colonoscopy to screen for colon cancer is recommended for all women at age 93.  We know, you hate the idea of the prep.  We agree, BUT, having colon cancer and not knowing it is worse!!  Colon cancer so often starts as a polyp that can be seen and removed at colonscopy, which can quite literally save your life!  And if your first colonoscopy is normal and you have no family history of colon cancer, most women don't have to have it again for 10 years.  Once every ten years, you can do something that may end up saving your life, right?  We will be happy to help you get it scheduled when you are ready.  Be sure to check your insurance coverage so you understand how much it will cost.  It may be covered as a preventative service at  no cost, but you should check your particular policy.      Breast Self-Awareness Breast self-awareness means being familiar with how your breasts look and feel. It involves checking your breasts regularly and reporting any changes to your health care  provider. Practicing breast self-awareness is important. A change in your breasts can be a sign of a serious medical problem. Being familiar with how your breasts look and feel allows you to find any problems early, when treatment is more likely to be successful. All women should practice breast self-awareness, including women who have had breast implants. How to do a breast self-exam One way to learn what is normal for your breasts and whether your breasts are changing is to do a breast self-exam. To do a breast self-exam: Look for Changes  Remove all the clothing above your waist. Stand in front of a mirror in a room with good lighting. Put your hands on your hips. Push your hands firmly downward. Compare your breasts in the mirror. Look for differences between them (asymmetry), such as: Differences in shape. Differences in size. Puckers, dips, and bumps in one breast and not the other. Look at each breast for changes in your skin, such as: Redness. Scaly areas. Look for changes in your nipples, such as: Discharge. Bleeding. Dimpling. Redness. A change in position. Feel for Changes Carefully feel your breasts for lumps and changes. It is best to do this while lying on your back on the floor and again while sitting or standing in the shower or tub with soapy water on your skin. Feel each breast in the following way: Place the arm on the side of the breast you are examining above your head. Feel your breast with the other hand. Start in the nipple area and make  inch (2 cm) overlapping circles to feel your breast. Use the pads of your three middle fingers to do this. Apply light pressure, then medium pressure, then firm pressure. The light pressure will allow you to feel the tissue closest to the skin. The medium pressure will allow you to feel the tissue that is a little deeper. The firm pressure will allow you to feel the tissue close to the ribs. Continue the overlapping circles,  moving downward over the breast until you feel your ribs below your breast. Move one finger-width toward the center of the body. Continue to use the  inch (2 cm) overlapping circles to feel your breast as you move slowly up toward your collarbone. Continue the up and down exam using all three pressures until you reach your armpit.  Write Down What You Find  Write down what is normal for each breast and any changes that you find. Keep a written record with breast changes or normal findings for each breast. By writing this information down, you do not need to depend only on memory for size, tenderness, or location. Write down where you are in your menstrual cycle, if you are still menstruating. If you are having trouble noticing differences in your breasts, do not get discouraged. With time you will become more familiar with the variations in your breasts and more comfortable with the exam. How often should I examine my breasts? Examine your breasts every month. If you are breastfeeding, the best time to examine your breasts is after a feeding or after using a breast pump. If you menstruate, the best time to examine your breasts is 5-7 days after your period is over. During your period, your breasts are   lumpier, and it may be more difficult to notice changes. When should I see my health care provider? See your health care provider if you notice: A change in shape or size of your breasts or nipples. A change in the skin of your breast or nipples, such as a reddened or scaly area. Unusual discharge from your nipples. A lump or thick area that was not there before. Pain in your breasts. Anything that concerns you.  

## 2021-05-09 LAB — SURESWAB® ADVANCED VAGINITIS,TMA
CANDIDA SPECIES: NOT DETECTED
Candida glabrata: NOT DETECTED
SURESWAB(R) ADV BACTERIAL VAGINOSIS(BV),TMA: POSITIVE — AB
TRICHOMONAS VAGINALIS (TV),TMA: NOT DETECTED

## 2021-05-10 LAB — URINALYSIS, COMPLETE W/RFL CULTURE
Bilirubin Urine: NEGATIVE
Casts: NONE SEEN /LPF
Glucose, UA: NEGATIVE
Hyaline Cast: NONE SEEN /LPF
Ketones, ur: NEGATIVE
Nitrites, Initial: NEGATIVE
Protein, ur: NEGATIVE
Specific Gravity, Urine: 1.015 (ref 1.001–1.035)
Yeast: NONE SEEN /HPF
pH: 6 (ref 5.0–8.0)

## 2021-05-10 LAB — URINE CULTURE
MICRO NUMBER:: 13104035
SPECIMEN QUALITY:: ADEQUATE

## 2021-05-10 LAB — CULTURE INDICATED

## 2021-06-19 ENCOUNTER — Encounter: Payer: Self-pay | Admitting: Obstetrics and Gynecology

## 2021-06-19 ENCOUNTER — Ambulatory Visit (INDEPENDENT_AMBULATORY_CARE_PROVIDER_SITE_OTHER): Payer: BC Managed Care – PPO | Admitting: Obstetrics and Gynecology

## 2021-06-19 VITALS — BP 132/84 | HR 66 | Ht 61.0 in | Wt 187.0 lb

## 2021-06-19 DIAGNOSIS — R3129 Other microscopic hematuria: Secondary | ICD-10-CM | POA: Diagnosis not present

## 2021-06-19 DIAGNOSIS — Z8742 Personal history of other diseases of the female genital tract: Secondary | ICD-10-CM | POA: Diagnosis not present

## 2021-06-19 LAB — URINALYSIS, COMPLETE
Crystals: NONE SEEN /HPF
Glucose, UA: NEGATIVE
Ketones, ur: NEGATIVE
Leukocytes,Ua: NEGATIVE
Nitrite: NEGATIVE
Protein, ur: NEGATIVE
Specific Gravity, Urine: 1.02 (ref 1.001–1.035)
Yeast: NONE SEEN /HPF
pH: 5.5 (ref 5.0–8.0)

## 2021-06-19 LAB — WET PREP FOR TRICH, YEAST, CLUE

## 2021-06-19 NOTE — Progress Notes (Signed)
GYNECOLOGY  VISIT ?  ?HPI: ?66 y.o.   Married White or Caucasian Not Hispanic or Latino  female   ?G1P0010 with Patient's last menstrual period was 12/27/2010 (exact date).   ?here for follow up for DIV. She was started on clindamycin cream last month for DIV. Her symptoms have resolved. No d/c, no odor.  ?At her visit last month she had 3-10 RBC/hpf on urinalysis.  ? ?GYNECOLOGIC HISTORY: ?Patient's last menstrual period was 12/27/2010 (exact date). ?Contraception:PMP ?Menopausal hormone therapy: none  ?       ?OB History   ? ? Gravida  ?1  ? Para  ?0  ? Term  ?0  ? Preterm  ?0  ? AB  ?1  ? Living  ?0  ?  ? ? SAB  ?0  ? IAB  ?0  ? Ectopic  ?0  ? Multiple  ?0  ? Live Births  ?0  ?   ?  ?  ?    ? ?Patient Active Problem List  ? Diagnosis Date Noted  ? 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 Colon cancer screening 04/30/2020  ? Irritable bowel syndrome 04/30/2020  ? Prediabetes 08/16/2019  ? Vitamin D deficiency 02/28/2014  ? Hypercholesterolemia 02/09/2013  ? Essential hypertension 08/07/2012  ? Asthma 08/07/2012  ? ? ?Past Medical History:  ?Diagnosis Date  ? Abnormal Pap smear 8/95  ? ASCUS  ? Fibroids 10/12  ? numerous  ? Gastritis   ? HPV in female   ? Hypercholesterolemia   ? Hypertension   ? IBS (irritable bowel syndrome)   ? Insomnia   ? Peptic ulcer   ? Smoker   ? ? ?Past Surgical History:  ?Procedure Laterality Date  ? CERVICAL DISCECTOMY  01/28/2000  ? ENDOMETRIAL BIOPSY  12/2010  ? negative path  ? EYE MUSCLE SURGERY Bilateral   ? FOOT SURGERY Right   ? KNEE ARTHROSCOPY W/ MENISCAL REPAIR Left   ? ROTATOR CUFF REPAIR Right 2009 or 2010  ? TRIGGER FINGER RELEASE  2012  ? right thumb  ? ? ?Current Outpatient Medications  ?Medication Sig Dispense Refill  ? clindamycin (CLEOCIN) 2 % vaginal cream Place 1 Applicatorful vaginally at bedtime. Use nightly for one month 160 g 0  ? cyclobenzaprine (FLEXERIL) 10 MG tablet Take 10 mg by mouth 3 (three) times daily as needed.    ? DULoxetine (CYMBALTA) 30 MG capsule     ?  gabapentin (NEURONTIN) 300 MG capsule TK 1 C PO HS PRN    ? HYDROcodone-acetaminophen (NORCO/VICODIN) 5-325 MG tablet TK 1 T PO Q 6 H PRF PAIN    ? losartan (COZAAR) 50 MG tablet Take 50 mg by mouth daily.    ? meloxicam (MOBIC) 7.5 MG tablet Take 7.5 mg by mouth daily.    ? metoprolol succinate (TOPROL-XL) 50 MG 24 hr tablet Take 1 tablet by mouth daily.    ? rosuvastatin (CRESTOR) 20 MG tablet Take by mouth daily.    ? ?No current facility-administered medications for this visit.  ?  ? ?ALLERGIES: Erythromycin and Penicillins ? ?Family History  ?Problem Relation Age of Onset  ? Thyroid disease Mother   ? Hypertension Mother   ? Hyperlipidemia Mother   ? Breast cancer Mother 49  ? Down syndrome Brother   ? ? ?Social History  ? ?Socioeconomic History  ? Marital status: Married  ?  Spouse name: Not on file  ? Number of children: 0  ? Years of education: Not on file  ?  Highest education level: Not on file  ?Occupational History  ? Not on file  ?Tobacco Use  ? Smoking status: Every Day  ?  Packs/day: 0.50  ?  Types: Cigarettes  ? Smokeless tobacco: Never  ?Vaping Use  ? Vaping Use: Never used  ?Substance and Sexual Activity  ? Alcohol use: Yes  ?  Alcohol/week: 3.0 - 4.0 standard drinks  ?  Types: 3 - 4 Standard drinks or equivalent per week  ? Drug use: No  ? Sexual activity: Not Currently  ?  Partners: Male  ?  Birth control/protection: Post-menopausal  ?Other Topics Concern  ? Not on file  ?Social History Narrative  ? Not on file  ? ?Social Determinants of Health  ? ?Financial Resource Strain: Not on file  ?Food Insecurity: Not on file  ?Transportation Needs: Not on file  ?Physical Activity: Not on file  ?Stress: Not on file  ?Social Connections: Not on file  ?Intimate Partner Violence: Not on file  ? ? ?Review of Systems  ?All other systems reviewed and are negative. ? ?PHYSICAL EXAMINATION:   ? ?LMP 12/27/2010 (Exact Date)     ?General appearance: alert, cooperative and appears stated age ? ?Pelvic: External  genitalia:  no lesions ?             Urethra:  normal appearing urethra with no masses, tenderness or lesions ?             Bartholins and Skenes: normal    ?             Vagina: mildly atrophic appearing vagina with normal color and discharge, no lesions ?             Cervix: no lesions ?             ?Chaperone was present for exam. ? ?1. History of vaginitis ?H/O DIV, s/p treatment, symptoms have resolved ?- WET PREP FOR TRICH, YEAST, CLUE: negative ? ? ?2. Microscopic hematuria ?- Urinalysis, Complete ?- Ambulatory referral to Urology ? ?

## 2021-09-27 ENCOUNTER — Telehealth: Payer: Self-pay | Admitting: Obstetrics and Gynecology

## 2021-09-27 DIAGNOSIS — N83202 Unspecified ovarian cyst, left side: Secondary | ICD-10-CM

## 2021-09-27 NOTE — Telephone Encounter (Signed)
Please let the patient know that I received a copy of her CT from Dr Matilde Sprang.  She was incidentally noted to have a 4.9 cm left ovarian cyst with possible wall irregularities. She needs an ultrasound and f/u visit with me. Please schedule this.

## 2021-09-30 NOTE — Telephone Encounter (Signed)
Left message for patient to call.

## 2021-10-01 NOTE — Telephone Encounter (Signed)
Ultrasound scheduled on 10/09/21

## 2021-10-01 NOTE — Telephone Encounter (Signed)
Patient aware, order placed. Message sent to appointments.

## 2021-10-09 ENCOUNTER — Ambulatory Visit (INDEPENDENT_AMBULATORY_CARE_PROVIDER_SITE_OTHER): Payer: BC Managed Care – PPO | Admitting: Obstetrics and Gynecology

## 2021-10-09 ENCOUNTER — Ambulatory Visit (INDEPENDENT_AMBULATORY_CARE_PROVIDER_SITE_OTHER): Payer: BC Managed Care – PPO

## 2021-10-09 ENCOUNTER — Encounter: Payer: Self-pay | Admitting: Obstetrics and Gynecology

## 2021-10-09 VITALS — BP 124/82 | HR 68

## 2021-10-09 DIAGNOSIS — N83202 Unspecified ovarian cyst, left side: Secondary | ICD-10-CM

## 2021-10-09 NOTE — Progress Notes (Signed)
GYNECOLOGY  VISIT   HPI: 66 y.o.   Married White or Caucasian Not Hispanic or Latino  female   G1P0010 with Patient's last menstrual period was 12/27/2010 (exact date).   here for evaluation of a 4.9 cm left ovarian cyst with possible wall irregularity that was picked up on CT done for hematuria. No symptoms.   CT report was reviewed.  GYNECOLOGIC HISTORY: Patient's last menstrual period was 12/27/2010 (exact date). 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   Colon cancer screening 04/30/2020   Irritable bowel syndrome 04/30/2020   Prediabetes 08/16/2019   Vitamin D deficiency 02/28/2014   Hypercholesterolemia 02/09/2013   Essential hypertension 08/07/2012   Asthma 08/07/2012    Past Medical History:  Diagnosis Date   Abnormal Pap smear 8/95   ASCUS   Fibroids 10/12   numerous   Gastritis    HPV in female    Hypercholesterolemia    Hypertension    IBS (irritable bowel syndrome)    Insomnia    Peptic ulcer    Smoker     Past Surgical History:  Procedure Laterality Date   CERVICAL DISCECTOMY  01/28/2000   ENDOMETRIAL BIOPSY  12/2010   negative path   EYE MUSCLE SURGERY Bilateral    FOOT SURGERY Right    KNEE ARTHROSCOPY W/ MENISCAL REPAIR Left    ROTATOR CUFF REPAIR Right 2009 or 2010   TRIGGER FINGER RELEASE  2012   right thumb    Current Outpatient Medications  Medication Sig Dispense Refill   cyclobenzaprine (FLEXERIL) 10 MG tablet Take 10 mg by mouth 3 (three) times daily as needed.     DULoxetine (CYMBALTA) 30 MG capsule      gabapentin (NEURONTIN) 300 MG capsule TK 1 C PO HS PRN     HYDROcodone-acetaminophen (NORCO/VICODIN) 5-325 MG tablet TK 1 T PO Q 6 H PRF PAIN      losartan (COZAAR) 50 MG tablet Take 50 mg by mouth daily.     meloxicam (MOBIC) 7.5 MG tablet Take 7.5 mg by mouth daily.     metoprolol succinate (TOPROL-XL) 50 MG 24 hr tablet Take 1 tablet by mouth daily.     rosuvastatin (CRESTOR) 20 MG tablet Take by mouth daily.     No current facility-administered medications for this visit.     ALLERGIES: Erythromycin and Penicillins  Family History  Problem Relation Age of Onset   Thyroid disease Mother    Hypertension Mother    Hyperlipidemia Mother    Breast cancer Mother 60   Down syndrome Brother     Social History   Socioeconomic History   Marital status: Married    Spouse name: Not on file   Number of children: 0   Years of education: Not on file   Highest education level: Not on file  Occupational History   Not on file  Tobacco Use   Smoking status: Every Day    Packs/day:  0.50    Types: Cigarettes   Smokeless tobacco: Never  Vaping Use   Vaping Use: Never used  Substance and Sexual Activity   Alcohol use: Yes    Alcohol/week: 3.0 - 4.0 standard drinks of alcohol    Types: 3 - 4 Standard drinks or equivalent per week   Drug use: No   Sexual activity: Not Currently    Partners: Male    Birth control/protection: Post-menopausal  Other Topics Concern   Not on file  Social History Narrative   Not on file   Social Determinants of Health   Financial Resource Strain: Not on file  Food Insecurity: Not on file  Transportation Needs: Not on file  Physical Activity: Not on file  Stress: Not on file  Social Connections: Not on file  Intimate Partner Violence: Not on file    Review of Systems  All other systems reviewed and are negative.   PHYSICAL EXAMINATION:    LMP 12/27/2010 (Exact Date)     General appearance: alert, cooperative and appears stated age  See u/s report  1. Left ovarian cyst 4.9 cm left ovarian cyst incidentally noted on CT. Today the cyst measures a maximum diameter of 4 cm, appears simple  and benign.  - CA 125 - US PELVIS TRANSVAGINAL NON-OB (TV ONLY); Future in 3 months  In addition to reviewing the ultrasound results, time was spent in counseling and recommendations for management.

## 2021-10-10 LAB — CA 125: CA 125: 8 U/mL (ref <35)

## 2021-11-21 ENCOUNTER — Other Ambulatory Visit: Payer: Self-pay | Admitting: Anesthesiology

## 2021-11-21 DIAGNOSIS — M545 Low back pain, unspecified: Secondary | ICD-10-CM

## 2021-12-03 ENCOUNTER — Other Ambulatory Visit (HOSPITAL_BASED_OUTPATIENT_CLINIC_OR_DEPARTMENT_OTHER): Payer: Self-pay

## 2021-12-03 MED ORDER — HYDROCODONE-ACETAMINOPHEN 10-325 MG PO TABS
1.0000 | ORAL_TABLET | Freq: Four times a day (QID) | ORAL | 0 refills | Status: DC | PRN
  Filled 2021-12-03: qty 120, 30d supply, fill #0

## 2021-12-04 ENCOUNTER — Other Ambulatory Visit (HOSPITAL_BASED_OUTPATIENT_CLINIC_OR_DEPARTMENT_OTHER): Payer: Self-pay

## 2021-12-09 ENCOUNTER — Ambulatory Visit
Admission: RE | Admit: 2021-12-09 | Discharge: 2021-12-09 | Disposition: A | Discharge status: Home / Self Care | Payer: BC Managed Care – PPO | Source: Ambulatory Visit | Attending: Anesthesiology | Admitting: Anesthesiology

## 2021-12-09 ENCOUNTER — Inpatient Hospital Stay: Admission: RE | Admit: 2021-12-09 | Payer: BC Managed Care – PPO | Source: Ambulatory Visit

## 2021-12-09 DIAGNOSIS — M545 Low back pain, unspecified: Secondary | ICD-10-CM

## 2021-12-16 ENCOUNTER — Other Ambulatory Visit (HOSPITAL_BASED_OUTPATIENT_CLINIC_OR_DEPARTMENT_OTHER): Payer: Self-pay

## 2021-12-16 MED ORDER — HYDROCODONE-ACETAMINOPHEN 10-325 MG PO TABS
1.0000 | ORAL_TABLET | Freq: Four times a day (QID) | ORAL | 0 refills | Status: DC | PRN
Start: 2021-12-16 — End: 2022-02-19
  Filled 2022-01-13: qty 120, 30d supply, fill #0

## 2022-01-09 ENCOUNTER — Telehealth: Payer: Self-pay | Admitting: Obstetrics and Gynecology

## 2022-01-09 ENCOUNTER — Ambulatory Visit (INDEPENDENT_AMBULATORY_CARE_PROVIDER_SITE_OTHER): Payer: BC Managed Care – PPO

## 2022-01-09 DIAGNOSIS — N83202 Unspecified ovarian cyst, left side: Secondary | ICD-10-CM | POA: Diagnosis not present

## 2022-01-09 NOTE — Telephone Encounter (Signed)
Patient informed, message sent to appointment to schedule.

## 2022-01-09 NOTE — Telephone Encounter (Signed)
Please let the patient know that I have reviewed her ultrasound images. Her left ovarian cyst is stable. I would recommend a f/u ultrasound in 6 months to document continued stability. If that is normal, I would repeat the u/s in one year. The u/s has been ordered, please schedule it.

## 2022-01-10 NOTE — Telephone Encounter (Signed)
Patient scheduled on 07/10/22

## 2022-01-13 ENCOUNTER — Other Ambulatory Visit (HOSPITAL_BASED_OUTPATIENT_CLINIC_OR_DEPARTMENT_OTHER): Payer: Self-pay

## 2022-01-20 ENCOUNTER — Other Ambulatory Visit: Payer: Self-pay | Admitting: Physical Medicine and Rehabilitation

## 2022-01-20 DIAGNOSIS — M48062 Spinal stenosis, lumbar region with neurogenic claudication: Secondary | ICD-10-CM

## 2022-01-30 ENCOUNTER — Other Ambulatory Visit: Payer: BC Managed Care – PPO

## 2022-02-06 DIAGNOSIS — R1031 Right lower quadrant pain: Secondary | ICD-10-CM | POA: Insufficient documentation

## 2022-02-06 DIAGNOSIS — M48061 Spinal stenosis, lumbar region without neurogenic claudication: Secondary | ICD-10-CM | POA: Insufficient documentation

## 2022-02-12 ENCOUNTER — Ambulatory Visit
Admission: RE | Admit: 2022-02-12 | Discharge: 2022-02-12 | Disposition: A | Discharge status: Home / Self Care | Payer: BC Managed Care – PPO | Source: Ambulatory Visit | Attending: Physical Medicine and Rehabilitation | Admitting: Physical Medicine and Rehabilitation

## 2022-02-12 DIAGNOSIS — M48062 Spinal stenosis, lumbar region with neurogenic claudication: Secondary | ICD-10-CM

## 2022-02-12 MED ORDER — ONDANSETRON HCL 4 MG/2ML IJ SOLN: 4.0000 mg | Freq: Once | INTRAMUSCULAR | Status: DC | PRN

## 2022-02-12 MED ORDER — IOPAMIDOL (ISOVUE-M 200) INJECTION 41%
18.0000 mL | Freq: Once | INTRAMUSCULAR | Status: AC
  Administered 2022-02-12: 18 mL via INTRATHECAL

## 2022-02-12 MED ORDER — DIAZEPAM 5 MG PO TABS
10.0000 mg | ORAL_TABLET | Freq: Once | ORAL | Status: AC
  Administered 2022-02-12: 10 mg via ORAL

## 2022-02-12 MED ORDER — MEPERIDINE HCL 50 MG/ML IJ SOLN: 50.0000 mg | Freq: Once | INTRAMUSCULAR | Status: DC | PRN

## 2022-02-12 NOTE — Discharge Instructions (Signed)

## 2022-02-18 ENCOUNTER — Other Ambulatory Visit (HOSPITAL_BASED_OUTPATIENT_CLINIC_OR_DEPARTMENT_OTHER): Payer: Self-pay

## 2022-02-19 ENCOUNTER — Other Ambulatory Visit (HOSPITAL_BASED_OUTPATIENT_CLINIC_OR_DEPARTMENT_OTHER): Payer: Self-pay

## 2022-02-19 MED ORDER — HYDROCODONE-ACETAMINOPHEN 10-325 MG PO TABS
1.0000 | ORAL_TABLET | Freq: Four times a day (QID) | ORAL | 0 refills | Status: DC | PRN
  Filled 2022-02-19: qty 120, 30d supply, fill #0

## 2022-02-20 ENCOUNTER — Other Ambulatory Visit (HOSPITAL_BASED_OUTPATIENT_CLINIC_OR_DEPARTMENT_OTHER): Payer: Self-pay

## 2022-03-19 ENCOUNTER — Ambulatory Visit: Payer: BC Managed Care – PPO | Admitting: Family Medicine

## 2022-04-04 ENCOUNTER — Other Ambulatory Visit (HOSPITAL_BASED_OUTPATIENT_CLINIC_OR_DEPARTMENT_OTHER): Payer: Self-pay

## 2022-04-04 MED ORDER — HYDROCODONE-ACETAMINOPHEN 10-325 MG PO TABS
1.0000 | ORAL_TABLET | Freq: Four times a day (QID) | ORAL | 0 refills | Status: DC | PRN
  Filled 2022-04-08: qty 120, 30d supply, fill #0

## 2022-04-08 ENCOUNTER — Other Ambulatory Visit (HOSPITAL_BASED_OUTPATIENT_CLINIC_OR_DEPARTMENT_OTHER): Payer: Self-pay

## 2022-04-16 ENCOUNTER — Ambulatory Visit: Payer: BC Managed Care – PPO | Admitting: Internal Medicine

## 2022-04-16 ENCOUNTER — Encounter: Payer: Self-pay | Admitting: Internal Medicine

## 2022-04-16 VITALS — BP 153/97 | HR 83 | Ht 61.0 in | Wt 183.2 lb

## 2022-04-16 DIAGNOSIS — Z01818 Encounter for other preprocedural examination: Secondary | ICD-10-CM

## 2022-04-16 DIAGNOSIS — F17200 Nicotine dependence, unspecified, uncomplicated: Secondary | ICD-10-CM

## 2022-04-16 DIAGNOSIS — I10 Essential (primary) hypertension: Secondary | ICD-10-CM

## 2022-04-16 NOTE — Progress Notes (Signed)
Primary Physician/Referring:  Lorenda Hatchet, FNP  Patient ID: Angela West, female    DOB: 11-03-1955, 67 y.o.   MRN: [id number]  Chief Complaint  Patient presents with  . Pre-op Exam  . New Patient (Initial Visit)   HPI:    Angela West  is a 67 y.o. ***  Past Medical History:  Diagnosis Date  . Abnormal Pap smear 8/95   ASCUS  . Fibroids 10/12   numerous  . Gastritis   . HPV in female   . Hypercholesterolemia   . Hypertension   . IBS (irritable bowel syndrome)   . Insomnia   . Peptic ulcer   . Smoker    Past Surgical History:  Procedure Laterality Date  . CERVICAL DISCECTOMY  01/28/2000  . ENDOMETRIAL BIOPSY  12/2010   negative path  . EYE MUSCLE SURGERY Bilateral   . FOOT SURGERY Right   . KNEE ARTHROSCOPY W/ MENISCAL REPAIR Left   . ROTATOR CUFF REPAIR Right 2009 or 2010  . TRIGGER FINGER RELEASE  2012   right thumb   Family History  Problem Relation Age of Onset  . Thyroid disease Mother   . Hypertension Mother   . Hyperlipidemia Mother   . Breast cancer Mother 35  . Down syndrome Brother     Social History   Tobacco Use  . Smoking status: Every Day    Packs/day: 0.50    Types: Cigarettes  . Smokeless tobacco: Never  Substance Use Topics  . Alcohol use: Yes    Alcohol/week: 3.0 - 4.0 standard drinks of alcohol    Types: 3 - 4 Standard drinks or equivalent per week   Marital Status: Married  ROS  ***ROS Objective  Blood pressure (!) 153/97, pulse 83, height 5' 1"$  (1.549 m), weight 183 lb 3.2 oz (83.1 kg), last menstrual period 12/27/2010, SpO2 96 %. Body mass index is 34.62 kg/m.     04/16/2022    8:55 AM 04/16/2022    8:45 AM 02/12/2022   10:28 AM  Vitals with BMI  Height  5' 1"$    Weight  183 lbs 3 oz   BMI  123456   Systolic 0000000 [id number] [id number]  Diastolic 97 [id number]  Pulse 83 85 69     ***Physical Exam  Medications and allergies   Allergies  Allergen Reactions  . Erythromycin     Other reaction(s): VOMITING  . Penicillins       Medication list after today's encounter   Current Outpatient Medications:  .  Cholecalciferol (VITAMIN D3) 50 MCG (2000 UT) CAPS, Take by mouth., Disp: , Rfl:  .  cyanocobalamin (VITAMIN B12) 1000 MCG tablet, Take 1,000 mcg by mouth daily., Disp: , Rfl:  .  cyclobenzaprine (FLEXERIL) 10 MG tablet, Take 10 mg by mouth 3 (three) times daily as needed., Disp: , Rfl:  .  DULoxetine (CYMBALTA) 30 MG capsule, , Disp: , Rfl:  .  gabapentin (NEURONTIN) 300 MG capsule, TK 1 C PO HS PRN, Disp: , Rfl:  .  glycopyrrolate 1 MG/5ML SOSY, , Disp: , Rfl:  .  HYDROcodone-acetaminophen (NORCO) 10-325 MG tablet, Take 1 tablet by mouth every 6 (six) hours as needed., Disp: 120 tablet, Rfl: 0 .  losartan (COZAAR) 50 MG tablet, Take 50 mg by mouth daily., Disp: , Rfl:  .  meloxicam (MOBIC) 7.5 MG tablet, Take 7.5 mg by mouth daily., Disp: , Rfl:  .  metoprolol succinate (TOPROL-XL) 50 MG 24 hr tablet, Take 1 tablet by  mouth daily., Disp: , Rfl:  .  rosuvastatin (CRESTOR) 20 MG tablet, Take by mouth daily., Disp: , Rfl:  .  tirzepatide (ZEPBOUND) 2.5 MG/0.5ML Pen, Inject 2.5 mg into the skin once a week., Disp: , Rfl:  .  Vibegron (GEMTESA) 75 MG TABS, , Disp: , Rfl:   Laboratory examination:   Lab Results  Component Value Date   NA 138 12/11/2017   K 4.0 12/11/2017   CO2 25 12/11/2017   GLUCOSE 115 (H) 12/11/2017   BUN 9 12/11/2017   CREATININE 0.63 12/11/2017   CALCIUM 9.0 12/11/2017   GFRNONAA >60 12/11/2017       Latest Ref Rng & Units 12/11/2017   11:07 AM 01/06/2013    2:14 PM  CMP  Glucose 70 - 99 mg/dL 115  95   BUN 6 - 20 mg/dL 9  8   Creatinine 0.44 - 1.00 mg/dL 0.63  0.75   Sodium 135 - 145 mmol/L 138  142   Potassium 3.5 - 5.1 mmol/L 4.0  5.2   Chloride 98 - 111 mmol/L 105  106   CO2 22 - 32 mmol/L 25  29   Calcium 8.9 - 10.3 mg/dL 9.0  10.4   Total Protein 6.0 - 8.3 g/dL  7.0   Total Bilirubin 0.3 - 1.2 mg/dL  0.6   Alkaline Phos 39 - 117 U/L  69   AST 0 - 37 U/L  21   ALT 0  - 35 U/L  22       Latest Ref Rng & Units 12/11/2017   11:07 AM 01/06/2013    2:04 PM  CBC  WBC 4.0 - 10.5 K/uL 7.8    Hemoglobin 12.0 - 15.0 g/dL 14.9  15.7   Hematocrit 36.0 - 46.0 % 43.4    Platelets 150 - 400 K/uL 270      Lipid Panel No results for input(s): "CHOL", "TRIG", "LDLCALC", "VLDL", "HDL", "CHOLHDL", "LDLDIRECT" in the last 8760 hours.  HEMOGLOBIN A1C No results found for: "HGBA1C", "MPG" TSH No results for input(s): "TSH" in the last 8760 hours.  External labs:   ***  Radiology:    Cardiac Studies:   No results found for this or any previous visit from the past 1095 days.     No results found for this or any previous visit from the past 1095 days.   ***  EKG:   ***  ***  Assessment     ICD-10-CM   1. Pre-op evaluation  Z01.818 EKG 12-Lead       Orders Placed This Encounter  Procedures  . EKG 12-Lead    No orders of the defined types were placed in this encounter.   Medications Discontinued During This Encounter  Medication Reason  . HYDROcodone-acetaminophen (NORCO/VICODIN) 5-325 MG tablet Dose change     Recommendations:   Angela West is a 67 y.o.  ***    Floydene Flock, DO, [facility]  04/16/2022, 9:16 AM Office: [phone] Pager: [phone]

## 2022-04-17 ENCOUNTER — Ambulatory Visit: Payer: Self-pay | Admitting: Physician Assistant

## 2022-04-17 DIAGNOSIS — G8929 Other chronic pain: Secondary | ICD-10-CM

## 2022-04-17 NOTE — H&P (Signed)
TOTAL HIP ADMISSION H&P  Patient is admitted for right total hip arthroplasty.  Subjective:  Chief Complaint: right hip pain  HPI: Angela West, 67 y.o. female, has a history of pain and functional disability in the right hip(s) due to arthritis and patient has failed non-surgical conservative treatments for greater than 12 weeks to include NSAID's and/or analgesics, corticosteriod injections, use of assistive devices, and activity modification.  Onset of symptoms was gradual starting 1 years ago with rapidlly worsening course since that time.The patient noted no past surgery on the right hip(s).  Patient currently rates pain in the right hip at 10 out of 10 with activity. Patient has night pain, worsening of pain with activity and weight bearing, trendelenberg gait, pain that interfers with activities of daily living, and pain with passive range of motion. Patient has evidence of periarticular osteophytes, joint space narrowing, and femoral head collapse  by imaging studies. This condition presents safety issues increasing the risk of falls.   There is no current active infection.  Patient Active Problem List   Diagnosis Date Noted  . Primary insomnia 03/05/2021  . Colon cancer screening 04/30/2020  . Irritable bowel syndrome 04/30/2020  . Prediabetes 08/16/2019  . Vitamin D deficiency 02/28/2014  . Hypercholesterolemia 02/09/2013  . Essential hypertension 08/07/2012  . Asthma 08/07/2012   Past Medical History:  Diagnosis Date  . Abnormal Pap smear 8/95   ASCUS  . Fibroids 10/12   numerous  . Gastritis   . HPV in female   . Hypercholesterolemia   . Hypertension   . IBS (irritable bowel syndrome)   . Insomnia   . Peptic ulcer   . Smoker     Past Surgical History:  Procedure Laterality Date  . CERVICAL DISCECTOMY  01/28/2000  . ENDOMETRIAL BIOPSY  12/2010   negative path  . EYE MUSCLE SURGERY Bilateral   . FOOT SURGERY Right   . KNEE ARTHROSCOPY W/ MENISCAL REPAIR Left   .  ROTATOR CUFF REPAIR Right 2009 or 2010  . TRIGGER FINGER RELEASE  2012   right thumb    Current Outpatient Medications  Medication Sig Dispense Refill Last Dose  . Cholecalciferol (VITAMIN D3) 50 MCG (2000 UT) CAPS Take by mouth.     . cyanocobalamin (VITAMIN B12) 1000 MCG tablet Take 1,000 mcg by mouth daily.     . cyclobenzaprine (FLEXERIL) 10 MG tablet Take 10 mg by mouth 3 (three) times daily as needed.     . DULoxetine (CYMBALTA) 30 MG capsule      . gabapentin (NEURONTIN) 300 MG capsule TK 1 C PO HS PRN     . glycopyrrolate 1 MG/5ML SOSY      . HYDROcodone-acetaminophen (NORCO) 10-325 MG tablet Take 1 tablet by mouth every 6 (six) hours as needed. 120 tablet 0   . losartan (COZAAR) 50 MG tablet Take 50 mg by mouth daily.     . meloxicam (MOBIC) 7.5 MG tablet Take 7.5 mg by mouth daily.     . metoprolol succinate (TOPROL-XL) 50 MG 24 hr tablet Take 1 tablet by mouth daily.     . rosuvastatin (CRESTOR) 20 MG tablet Take by mouth daily.     . tirzepatide (ZEPBOUND) 2.5 MG/0.5ML Pen Inject 2.5 mg into the skin once a week.     . Vibegron (GEMTESA) 75 MG TABS       No current facility-administered medications for this visit.   Allergies  Allergen Reactions  . Erythromycin     Other  reaction(s): VOMITING  . Penicillins     Social History   Tobacco Use  . Smoking status: Every Day    Packs/day: 0.50    Types: Cigarettes  . Smokeless tobacco: Never  Substance Use Topics  . Alcohol use: Yes    Alcohol/week: 3.0 - 4.0 standard drinks of alcohol    Types: 3 - 4 Standard drinks or equivalent per week    Family History  Problem Relation Age of Onset  . Thyroid disease Mother   . Hypertension Mother   . Hyperlipidemia Mother   . Breast cancer Mother 17  . Down syndrome Brother      Review of Systems  Cardiovascular:  Positive for chest pain.  Gastrointestinal:  Positive for constipation and diarrhea.  Genitourinary:  Positive for hematuria.  Musculoskeletal:  Positive for  arthralgias and back pain.  Neurological:  Positive for headaches.  All other systems reviewed and are negative.  Objective:  Physical Exam Constitutional:      General: She is not in acute distress.    Appearance: Normal appearance.  HENT:     Head: Normocephalic and atraumatic.  Eyes:     Extraocular Movements: Extraocular movements intact.     Pupils: Pupils are equal, round, and reactive to light.  Cardiovascular:     Rate and Rhythm: Normal rate and regular rhythm.     Pulses: Normal pulses.     Heart sounds: Normal heart sounds.  Pulmonary:     Effort: Pulmonary effort is normal. No respiratory distress.     Breath sounds: Normal breath sounds. No wheezing.  Abdominal:     General: Abdomen is flat. Bowel sounds are normal. There is no distension.     Palpations: Abdomen is soft.     Tenderness: There is no abdominal tenderness.  Musculoskeletal:     Cervical back: Normal range of motion and neck supple.     Right hip: Tenderness and bony tenderness present. Decreased range of motion. Decreased strength.  Lymphadenopathy:     Cervical: No cervical adenopathy.  Skin:    General: Skin is warm and dry.     Findings: No erythema or rash.  Neurological:     General: No focal deficit present.     Mental Status: She is alert and oriented to person, place, and time.  Psychiatric:        Mood and Affect: Mood normal.        Behavior: Behavior normal.   Vital signs in last 24 hours: @VSRANGES$ @  Labs:   Estimated body mass index is 34.62 kg/m as calculated from the following:   Height as of 04/16/22: 5' 1"$  (1.549 m).   Weight as of 04/16/22: 83.1 kg.   Imaging Review Plain radiographs demonstrate severe degenerative joint disease of the right hip(s). The bone quality appears to be good for age and reported activity level.      Assessment/Plan:  End stage arthritis, right hip(s)  The patient history, physical examination, clinical judgement of the provider and  imaging studies are consistent with end stage degenerative joint disease of the right hip(s) and total hip arthroplasty is deemed medically necessary. The treatment options including medical management, injection therapy, arthroscopy and arthroplasty were discussed at length. The risks and benefits of total hip arthroplasty were presented and reviewed. The risks due to aseptic loosening, infection, stiffness, dislocation/subluxation,  thromboembolic complications and other imponderables were discussed.  The patient acknowledged the explanation, agreed to proceed with the plan and consent was signed.  Patient is being admitted for inpatient treatment for surgery, pain control, PT, OT, prophylactic antibiotics, VTE prophylaxis, progressive ambulation and ADL's and discharge planning.The patient is planning to be discharged  home with outpt PT.    Patient's anticipated LOS is less than 2 midnights, meeting these requirements: - Younger than 5 - Lives within 1 hour of care - Has a competent adult at home to recover with post-op recover - NO history of  - Chronic pain requiring opiods  - Diabetes  - Coronary Artery Disease  - Heart failure  - Heart attack  - Stroke  - DVT/VTE  - Cardiac arrhythmia  - Respiratory Failure/COPD  - Renal failure  - Anemia  - Advanced Liver disease

## 2022-04-17 NOTE — H&P (View-Only) (Signed)
TOTAL HIP ADMISSION H&P  Patient is admitted for right total hip arthroplasty.  Subjective:  Chief Complaint: right hip pain  HPI: Angela West, 67 y.o. female, has a history of pain and functional disability in the right hip(s) due to arthritis and patient has failed non-surgical conservative treatments for greater than 12 weeks to include NSAID's and/or analgesics, corticosteriod injections, use of assistive devices, and activity modification.  Onset of symptoms was gradual starting 1 years ago with rapidlly worsening course since that time.The patient noted no past surgery on the right hip(s).  Patient currently rates pain in the right hip at 10 out of 10 with activity. Patient has night pain, worsening of pain with activity and weight bearing, trendelenberg gait, pain that interfers with activities of daily living, and pain with passive range of motion. Patient has evidence of periarticular osteophytes, joint space narrowing, and femoral head collapse  by imaging studies. This condition presents safety issues increasing the risk of falls.   There is no current active infection.  Patient Active Problem List   Diagnosis Date Noted  . Primary insomnia 03/05/2021  . Colon cancer screening 04/30/2020  . Irritable bowel syndrome 04/30/2020  . Prediabetes 08/16/2019  . Vitamin D deficiency 02/28/2014  . Hypercholesterolemia 02/09/2013  . Essential hypertension 08/07/2012  . Asthma 08/07/2012   Past Medical History:  Diagnosis Date  . Abnormal Pap smear 8/95   ASCUS  . Fibroids 10/12   numerous  . Gastritis   . HPV in female   . Hypercholesterolemia   . Hypertension   . IBS (irritable bowel syndrome)   . Insomnia   . Peptic ulcer   . Smoker     Past Surgical History:  Procedure Laterality Date  . CERVICAL DISCECTOMY  01/28/2000  . ENDOMETRIAL BIOPSY  12/2010   negative path  . EYE MUSCLE SURGERY Bilateral   . FOOT SURGERY Right   . KNEE ARTHROSCOPY W/ MENISCAL REPAIR Left   .  ROTATOR CUFF REPAIR Right 2009 or 2010  . TRIGGER FINGER RELEASE  2012   right thumb    Current Outpatient Medications  Medication Sig Dispense Refill Last Dose  . Cholecalciferol (VITAMIN D3) 50 MCG (2000 UT) CAPS Take by mouth.     . cyanocobalamin (VITAMIN B12) 1000 MCG tablet Take 1,000 mcg by mouth daily.     . cyclobenzaprine (FLEXERIL) 10 MG tablet Take 10 mg by mouth 3 (three) times daily as needed.     . DULoxetine (CYMBALTA) 30 MG capsule      . gabapentin (NEURONTIN) 300 MG capsule TK 1 C PO HS PRN     . glycopyrrolate 1 MG/5ML SOSY      . HYDROcodone-acetaminophen (NORCO) 10-325 MG tablet Take 1 tablet by mouth every 6 (six) hours as needed. 120 tablet 0   . losartan (COZAAR) 50 MG tablet Take 50 mg by mouth daily.     . meloxicam (MOBIC) 7.5 MG tablet Take 7.5 mg by mouth daily.     . metoprolol succinate (TOPROL-XL) 50 MG 24 hr tablet Take 1 tablet by mouth daily.     . rosuvastatin (CRESTOR) 20 MG tablet Take by mouth daily.     . tirzepatide (ZEPBOUND) 2.5 MG/0.5ML Pen Inject 2.5 mg into the skin once a week.     . Vibegron (GEMTESA) 75 MG TABS       No current facility-administered medications for this visit.   Allergies  Allergen Reactions  . Erythromycin     Other  reaction(s): VOMITING  . Penicillins     Social History   Tobacco Use  . Smoking status: Every Day    Packs/day: 0.50    Types: Cigarettes  . Smokeless tobacco: Never  Substance Use Topics  . Alcohol use: Yes    Alcohol/week: 3.0 - 4.0 standard drinks of alcohol    Types: 3 - 4 Standard drinks or equivalent per week    Family History  Problem Relation Age of Onset  . Thyroid disease Mother   . Hypertension Mother   . Hyperlipidemia Mother   . Breast cancer Mother 43  . Down syndrome Brother      Review of Systems  Cardiovascular:  Positive for chest pain.  Gastrointestinal:  Positive for constipation and diarrhea.  Genitourinary:  Positive for hematuria.  Musculoskeletal:  Positive for  arthralgias and back pain.  Neurological:  Positive for headaches.  All other systems reviewed and are negative.  Objective:  Physical Exam Constitutional:      General: She is not in acute distress.    Appearance: Normal appearance.  HENT:     Head: Normocephalic and atraumatic.  Eyes:     Extraocular Movements: Extraocular movements intact.     Pupils: Pupils are equal, round, and reactive to light.  Cardiovascular:     Rate and Rhythm: Normal rate and regular rhythm.     Pulses: Normal pulses.     Heart sounds: Normal heart sounds.  Pulmonary:     Effort: Pulmonary effort is normal. No respiratory distress.     Breath sounds: Normal breath sounds. No wheezing.  Abdominal:     General: Abdomen is flat. Bowel sounds are normal. There is no distension.     Palpations: Abdomen is soft.     Tenderness: There is no abdominal tenderness.  Musculoskeletal:     Cervical back: Normal range of motion and neck supple.     Right hip: Tenderness and bony tenderness present. Decreased range of motion. Decreased strength.  Lymphadenopathy:     Cervical: No cervical adenopathy.  Skin:    General: Skin is warm and dry.     Findings: No erythema or rash.  Neurological:     General: No focal deficit present.     Mental Status: She is alert and oriented to person, place, and time.  Psychiatric:        Mood and Affect: Mood normal.        Behavior: Behavior normal.   Vital signs in last 24 hours: '@VSRANGES'$ @  Labs:   Estimated body mass index is 34.62 kg/m as calculated from the following:   Height as of 04/16/22: '5\' 1"'$  (1.549 m).   Weight as of 04/16/22: 83.1 kg.   Imaging Review Plain radiographs demonstrate severe degenerative joint disease of the right hip(s). The bone quality appears to be good for age and reported activity level.      Assessment/Plan:  End stage arthritis, right hip(s)  The patient history, physical examination, clinical judgement of the provider and  imaging studies are consistent with end stage degenerative joint disease of the right hip(s) and total hip arthroplasty is deemed medically necessary. The treatment options including medical management, injection therapy, arthroscopy and arthroplasty were discussed at length. The risks and benefits of total hip arthroplasty were presented and reviewed. The risks due to aseptic loosening, infection, stiffness, dislocation/subluxation,  thromboembolic complications and other imponderables were discussed.  The patient acknowledged the explanation, agreed to proceed with the plan and consent was signed.  Patient is being admitted for inpatient treatment for surgery, pain control, PT, OT, prophylactic antibiotics, VTE prophylaxis, progressive ambulation and ADL's and discharge planning.The patient is planning to be discharged  home with outpt PT.    Patient's anticipated LOS is less than 2 midnights, meeting these requirements: - Younger than 46 - Lives within 1 hour of care - Has a competent adult at home to recover with post-op recover - NO history of  - Chronic pain requiring opiods  - Diabetes  - Coronary Artery Disease  - Heart failure  - Heart attack  - Stroke  - DVT/VTE  - Cardiac arrhythmia  - Respiratory Failure/COPD  - Renal failure  - Anemia  - Advanced Liver disease

## 2022-04-22 ENCOUNTER — Encounter (HOSPITAL_COMMUNITY): Payer: Self-pay

## 2022-04-22 NOTE — Progress Notes (Signed)
PCP - Caryl Ada, [place]   labs from 02-04-22 on chart Cardiologist - Preop eval 04-16-22 epic Floydene Flock, DO  in epic  PPM/ICD -  Device Orders -  Rep Notified -   Chest x-ray -  EKG - 04-16-22 epic Stress Test -  ECHO -  Cardiac Cath -   Sleep Study -  CPAP -   Fasting Blood Sugar -  Checks Blood Sugar __0___ times a day  Blood Thinner Instructions: Aspirin Instructions:  ERAS Protcol -y PRE-SURGERY G2-    COVID vaccine -  Activity--ABLE TO COMPLETE adl'S WITHOUT sob OR cp Anesthesia review: HTN, Asthma,  ZEPBOUND LAST DOSE 04-28-22  Patient denies shortness of breath, fever, cough and chest pain at PAT appointment   All instructions explained to the patient, with a verbal understanding of the material. Patient agrees to go over the instructions while at home for a better understanding. Patient also instructed to self quarantine after being tested for COVID-19. The opportunity to ask questions was provided.

## 2022-04-22 NOTE — Patient Instructions (Signed)
SURGICAL WAITING ROOM VISITATION  Patients having surgery or a procedure may have no more than 2 support people in the waiting area - these visitors may rotate.    Children under the age of 39 must have an adult with them who is not the patient.  Due to an increase in RSV and influenza rates and associated hospitalizations, children ages 3 and under may not visit patients in [place].  If the patient needs to stay at the hospital during part of their recovery, the visitor guidelines for inpatient rooms apply. Pre-op nurse will coordinate an appropriate time for 1 support person to accompany patient in pre-op.  This support person may not rotate.    Please refer to the [facility] website for the visitor guidelines for Inpatients (after your surgery is over and you are in a regular room).       Your procedure is scheduled on: 05-07-22   Report to [facility] Main Entrance    Report to admitting at     1030  AM   Call this number if you have problems the morning of surgery [phone]   Do not eat food :After Midnight.   After Midnight you may have the following liquids until _10:00_____ AM/DAY OF SURGERY  then nothing by mouth  Water Non-Citrus Juices (without pulp, NO RED-Apple, White grape, White cranberry) Black Coffee (NO MILK/CREAM OR CREAMERS, sugar ok)  Clear Tea (NO MILK/CREAM OR CREAMERS, sugar ok) regular and decaf                             Plain Jell-O (NO RED)                                           Fruit ices (not with fruit pulp, NO RED)                                     Popsicles (NO RED)                                                               Sports drinks like Gatorade (NO RED)                    The day of surgery:  Drink ONE (1) Pre-Surgery G2 at    0950  AM the morning of surgery. Drink in one sitting. Do not sip.  This drink was given to you during your hospital  pre-op appointment visit. Nothing else to drink after  completing the  Pre-Surgery or G2.   By 10:00 am          If you have questions, please contact your surgeon's office.   FOLLOW  ANY ADDITIONAL PRE OP INSTRUCTIONS YOU RECEIVED FROM YOUR SURGEON'S OFFICE!!!     Oral Hygiene is also important to reduce your risk of infection.  Remember - BRUSH YOUR TEETH THE MORNING OF SURGERY WITH YOUR REGULAR TOOTHPASTE  DENTURES WILL BE REMOVED PRIOR TO SURGERY PLEASE DO NOT APPLY "Poly grip" OR ADHESIVES!!!   Do NOT smoke after Midnight   Take these medicines the morning of surgery with A SIP OF WATER: metoprolol, rosuvastatin, duloxentine, gabapentin if needed, hydrocodone if needed  DO NOT TAKE ANY ORAL DIABETIC MEDICATIONS DAY OF YOUR SURGERY  Bring CPAP mask and tubing day of surgery.                              You may not have any metal on your body including hair pins, jewelry, and body piercing             Do not wear make-up, lotions, powders, perfumes/cologne, or deodorant  Do not wear nail polish including gel and S&S, artificial/acrylic nails, or any other type of covering on natural nails including finger and toenails. If you have artificial nails, gel coating, etc. that needs to be removed by a nail salon please have this removed prior to surgery or surgery may need to be canceled/ delayed if the surgeon/ anesthesia feels like they are unable to be safely monitored.   Do not shave  48 hours prior to surgery.             Do not bring valuables to the hospital. [place].   Contacts, glasses, dentures or bridgework may not be worn into surgery.   Bring small overnight bag day of surgery.   DO NOT [place]. PHARMACY WILL DISPENSE MEDICATIONS LISTED ON YOUR MEDICATION LIST TO YOU DURING YOUR ADMISSION [place]!    Patients discharged on the day of surgery will not be allowed to drive home.  Someone NEEDS  to stay with you for the first 24 hours after anesthesia.   Special Instructions: Bring a copy of your healthcare power of attorney and living will documents the day of surgery if you haven't scanned them before.              Please read over the following fact sheets you were given: IF [place] [phone]   If you received a COVID test during your pre-op visit  it is requested that you wear a mask when out in public, stay away from anyone that may not be feeling well and notify your surgeon if you develop symptoms. If you test positive for Covid or have been in contact with anyone that has tested positive in the last 10 days please notify you surgeon.    [place] - Preparing for Surgery Before surgery, you can play an important role.  Because skin is not sterile, your skin needs to be as free of germs as possible.  You can reduce the number of germs on your skin by washing with CHG (chlorahexidine gluconate) soap before surgery.  CHG is an antiseptic cleaner which kills germs and bonds with the skin to continue killing germs even after washing. Please DO NOT use if you have an allergy to CHG or antibacterial soaps.  If your skin becomes reddened/irritated stop using the CHG and inform your nurse when you arrive at Short Stay. Do not shave (including legs and underarms) for at least 48 hours  prior to the first CHG shower.  You may shave your face/neck. Please follow these instructions carefully:  1.  Shower with CHG Soap the night before surgery and the  morning of Surgery.  2.  If you choose to wash your hair, wash your hair first as usual with your  normal  shampoo.  3.  After you shampoo, rinse your hair and body thoroughly to remove the  shampoo.                           4.  Use CHG as you would any other liquid soap.  You can apply chg directly  to the skin and wash                       Gently with a scrungie or clean washcloth.  5.   Apply the CHG Soap to your body ONLY FROM THE NECK DOWN.   Do not use on face/ open                           Wound or open sores. Avoid contact with eyes, ears mouth and genitals (private parts).                       Wash face,  Genitals (private parts) with your normal soap.             6.  Wash thoroughly, paying special attention to the area where your surgery  will be performed.  7.  Thoroughly rinse your body with warm water from the neck down.  8.  DO NOT shower/wash with your normal soap after using and rinsing off  the CHG Soap.                9.  Pat yourself dry with a clean towel.            10.  Wear clean pajamas.            11.  Place clean sheets on your bed the night of your first shower and do not  sleep with pets. Day of Surgery : Do not apply any lotions/deodorants the morning of surgery.  Please wear clean clothes to the hospital/surgery center.  FAILURE TO FOLLOW THESE INSTRUCTIONS MAY RESULT IN THE CANCELLATION OF YOUR SURGERY PATIENT SIGNATURE_________________________________  NURSE SIGNATURE__________________________________  ________________________________________________________________________  Angela West  An incentive spirometer is a tool that can help keep your lungs clear and active. This tool measures how well you are filling your lungs with each breath. Taking long deep breaths may help reverse or decrease the chance of developing breathing (pulmonary) problems (especially infection) following: A long period of time when you are unable to move or be active. BEFORE THE PROCEDURE  If the spirometer includes an indicator to show your best effort, your nurse or respiratory therapist will set it to a desired goal. If possible, sit up straight or lean slightly forward. Try not to slouch. Hold the incentive spirometer in an upright position. INSTRUCTIONS FOR USE  Sit on the edge of your bed if possible, or sit up as far as you can in bed or on a  chair. Hold the incentive spirometer in an upright position. Breathe out normally. Place the mouthpiece in your mouth and seal your lips tightly around it. Breathe in slowly and as deeply as possible, raising the piston  or the ball toward the top of the column. Hold your breath for 3-5 seconds or for as long as possible. Allow the piston or ball to fall to the bottom of the column. Remove the mouthpiece from your mouth and breathe out normally. Rest for a few seconds and repeat Steps 1 through 7 at least 10 times every 1-2 hours when you are awake. Take your time and take a few normal breaths between deep breaths. The spirometer may include an indicator to show your best effort. Use the indicator as a goal to work toward during each repetition. After each set of 10 deep breaths, practice coughing to be sure your lungs are clear. If you have an incision (the cut made at the time of surgery), support your incision when coughing by placing a pillow or rolled up towels firmly against it. Once you are able to get out of bed, walk around indoors and cough well. You may stop using the incentive spirometer when instructed by your caregiver.  RISKS AND COMPLICATIONS Take your time so you do not get dizzy or light-headed. If you are in pain, you may need to take or ask for pain medication before doing incentive spirometry. It is harder to take a deep breath if you are having pain. AFTER USE Rest and breathe slowly and easily. It can be helpful to keep track of a log of your progress. Your caregiver can provide you with a simple table to help with this. If you are using the spirometer at home, follow these instructions: [place] IF:  You are having difficultly using the spirometer. You have trouble using the spirometer as often as instructed. Your pain medication is not giving enough relief while using the spirometer. You develop fever of 100.5 F (38.1 C) or higher. SEEK IMMEDIATE MEDICAL CARE  IF:  You cough up bloody sputum that had not been present before. You develop fever of 102 F (38.9 C) or greater. You develop worsening pain at or near the incision site. MAKE SURE YOU:  Understand these instructions. Will watch your condition. Will get help right away if you are not doing well or get worse. Document Released: 06/30/2006 Document Revised: 05/12/2011 Document Reviewed: 08/31/2006 [employer] Patient Information 2014 [employer], [place].   ________________________________________________________________________ WHAT IS A BLOOD TRANSFUSION? Blood Transfusion Information  A transfusion is the replacement of blood or some of its parts. Blood is made up of multiple cells which provide different functions. Red blood cells carry oxygen and are used for blood loss replacement. White blood cells fight against infection. Platelets control bleeding. Plasma helps clot blood. Other blood products are available for specialized needs, such as hemophilia or other clotting disorders. BEFORE THE TRANSFUSION  Who gives blood for transfusions?  Healthy volunteers who are fully evaluated to make sure their blood is safe. This is blood bank blood. Transfusion therapy is the safest it has ever been in the practice of medicine. Before blood is taken from a donor, a complete history is taken to make sure that person has no history of diseases nor engages in risky social behavior (examples are intravenous drug use or sexual activity with multiple partners). The donor's travel history is screened to minimize risk of transmitting infections, such as malaria. The donated blood is tested for signs of infectious diseases, such as HIV and hepatitis. The blood is then tested to be sure it is compatible with you in order to minimize the chance of a transfusion reaction. If you or a  relative donates blood, this is often done in anticipation of surgery and is not appropriate for emergency situations. It takes many  days to process the donated blood. RISKS AND COMPLICATIONS Although transfusion therapy is very safe and saves many lives, the main dangers of transfusion include:  Getting an infectious disease. Developing a transfusion reaction. This is an allergic reaction to something in the blood you were given. Every precaution is taken to prevent this. The decision to have a blood transfusion has been considered carefully by your caregiver before blood is given. Blood is not given unless the benefits outweigh the risks. AFTER THE TRANSFUSION Right after receiving a blood transfusion, you will usually feel much better and more energetic. This is especially true if your red blood cells have gotten low (anemic). The transfusion raises the level of the red blood cells which carry oxygen, and this usually causes an energy increase. The nurse administering the transfusion will monitor you carefully for complications. HOME CARE INSTRUCTIONS  No special instructions are needed after a transfusion. You may find your energy is better. Speak with your caregiver about any limitations on activity for underlying diseases you may have. SEEK MEDICAL CARE IF:  Your condition is not improving after your transfusion. You develop redness or irritation at the intravenous (IV) site. SEEK IMMEDIATE MEDICAL CARE IF:  Any of the following symptoms occur over the next 12 hours: Shaking chills. You have a temperature by mouth above 102 F (38.9 C), not controlled by medicine. Chest, back, or muscle pain. People around you feel you are not acting correctly or are confused. Shortness of breath or difficulty breathing. Dizziness and fainting. You get a rash or develop hives. You have a decrease in urine output. Your urine turns a dark color or changes to pink, red, or brown. Any of the following symptoms occur over the next 10 days: You have a temperature by mouth above 102 F (38.9 C), not controlled by medicine. Shortness of  breath. Weakness after normal activity. The white part of the eye turns yellow (jaundice). You have a decrease in the amount of urine or are urinating less often. Your urine turns a dark color or changes to pink, red, or brown. Document Released: 02/15/2000 Document Revised: 05/12/2011 Document Reviewed: 10/04/2007 [facility] Patient Information 2014 [place], [place].  _______________________________________________________________________

## 2022-04-23 ENCOUNTER — Other Ambulatory Visit (HOSPITAL_BASED_OUTPATIENT_CLINIC_OR_DEPARTMENT_OTHER): Payer: Self-pay

## 2022-04-23 MED ORDER — HYDROCODONE-ACETAMINOPHEN 10-325 MG PO TABS
1.0000 | ORAL_TABLET | ORAL | 0 refills | Status: DC | PRN
  Filled 2022-05-05: qty 130, 22d supply, fill #0

## 2022-04-24 ENCOUNTER — Telehealth: Payer: Self-pay

## 2022-04-24 ENCOUNTER — Other Ambulatory Visit: Payer: Self-pay

## 2022-04-24 ENCOUNTER — Encounter: Payer: Self-pay | Admitting: Obstetrics and Gynecology

## 2022-04-24 ENCOUNTER — Encounter (HOSPITAL_COMMUNITY): Payer: Self-pay

## 2022-04-24 ENCOUNTER — Encounter (HOSPITAL_COMMUNITY)
Admission: RE | Admit: 2022-04-24 | Discharge: 2022-04-24 | Disposition: A | Discharge status: Home / Self Care | Payer: BC Managed Care – PPO | Source: Ambulatory Visit | Attending: Orthopedic Surgery | Admitting: Orthopedic Surgery

## 2022-04-24 VITALS — BP 136/89 | HR 81 | Temp 98.3°F | Resp 16 | Ht 61.0 in | Wt 180.0 lb

## 2022-04-24 DIAGNOSIS — Z01818 Encounter for other preprocedural examination: Secondary | ICD-10-CM

## 2022-04-24 DIAGNOSIS — M25551 Pain in right hip: Secondary | ICD-10-CM | POA: Insufficient documentation

## 2022-04-24 DIAGNOSIS — R7303 Prediabetes: Secondary | ICD-10-CM | POA: Insufficient documentation

## 2022-04-24 DIAGNOSIS — Z01812 Encounter for preprocedural laboratory examination: Secondary | ICD-10-CM | POA: Diagnosis present

## 2022-04-24 DIAGNOSIS — G8929 Other chronic pain: Secondary | ICD-10-CM | POA: Diagnosis not present

## 2022-04-24 HISTORY — DX: Unspecified asthma, uncomplicated: J45.909

## 2022-04-24 HISTORY — DX: Other complications of anesthesia, initial encounter: T88.59XA

## 2022-04-24 HISTORY — DX: Unspecified osteoarthritis, unspecified site: M19.90

## 2022-04-24 HISTORY — DX: Prediabetes: R73.03

## 2022-04-24 HISTORY — DX: Anemia, unspecified: D64.9

## 2022-04-24 LAB — CBC WITH DIFFERENTIAL/PLATELET
Abs Immature Granulocytes: 0.03 10*3/uL (ref 0.00–0.07)
Basophils Absolute: 0.1 10*3/uL (ref 0.0–0.1)
Basophils Relative: 1 %
Eosinophils Absolute: 0.2 10*3/uL (ref 0.0–0.5)
Eosinophils Relative: 2 %
HCT: 42.6 % (ref 36.0–46.0)
Hemoglobin: 14.5 g/dL (ref 12.0–15.0)
Immature Granulocytes: 0 %
Lymphocytes Relative: 32 %
Lymphs Abs: 3.2 10*3/uL (ref 0.7–4.0)
MCH: 32.2 pg (ref 26.0–34.0)
MCHC: 34 g/dL (ref 30.0–36.0)
MCV: 94.7 fL (ref 80.0–100.0)
Monocytes Absolute: 0.8 10*3/uL (ref 0.1–1.0)
Monocytes Relative: 8 %
Neutro Abs: 5.6 10*3/uL (ref 1.7–7.7)
Neutrophils Relative %: 57 %
Platelets: 266 10*3/uL (ref 150–400)
RBC: 4.5 MIL/uL (ref 3.87–5.11)
RDW: 12.7 % (ref 11.5–15.5)
WBC: 9.9 10*3/uL (ref 4.0–10.5)
nRBC: 0 % (ref 0.0–0.2)

## 2022-04-24 LAB — TYPE AND SCREEN
ABO/RH(D): A POS
Antibody Screen: NEGATIVE

## 2022-04-24 LAB — COMPREHENSIVE METABOLIC PANEL
ALT: 23 U/L (ref 0–44)
AST: 20 U/L (ref 15–41)
Albumin: 4.1 g/dL (ref 3.5–5.0)
Alkaline Phosphatase: 56 U/L (ref 38–126)
Anion gap: 10 (ref 5–15)
BUN: 17 mg/dL (ref 8–23)
CO2: 24 mmol/L (ref 22–32)
Calcium: 9.1 mg/dL (ref 8.9–10.3)
Chloride: 103 mmol/L (ref 98–111)
Creatinine, Ser: 0.83 mg/dL (ref 0.44–1.00)
GFR, Estimated: >60 mL/min (ref >60)
Glucose, Bld: 85 mg/dL (ref 70–99)
Potassium: 4.6 mmol/L (ref 3.5–5.1)
Sodium: 137 mmol/L (ref 135–145)
Total Bilirubin: 0.5 mg/dL (ref 0.3–1.2)
Total Protein: 6.9 g/dL (ref 6.5–8.1)

## 2022-04-24 LAB — HEMOGLOBIN A1C
Hgb A1c MFr Bld: 5.5 % (ref 4.8–5.6)
Mean Plasma Glucose: 111.15 mg/dL

## 2022-04-24 LAB — SURGICAL PCR SCREEN
MRSA, PCR: NEGATIVE
Staphylococcus aureus: NEGATIVE

## 2022-04-24 LAB — GLUCOSE, CAPILLARY: Glucose-Capillary: 97 mg/dL (ref 70–99)

## 2022-04-24 NOTE — Telephone Encounter (Signed)
Left message to call Casanova Schurman, RN at [facility], [phone], OPT 5.  

## 2022-04-24 NOTE — Telephone Encounter (Signed)
Spoke with patient, advised per Dr. Talbert Nan.  Questions answered.  Patient verbalizes understanding.   Routing to provider for final review. Patient is agreeable to disposition. Will close encounter.

## 2022-04-24 NOTE — Telephone Encounter (Signed)
Pt calling to report upcoming hip surgery. She reported that she just left hospital and was notified that it was reported in her chart by Dr. Talbert Nan that she was a pre-diabetic. Pt states that her blood sugar is being monitored by her PCP and that her levels have been fine and her BS today was 97 so she is requesting that this be removed from her chart so that she can be given regular treatment before, during, and after her procedure instead of a low sugar treatment. Please advise.   CC: Glorianne [place]

## 2022-04-24 NOTE — Telephone Encounter (Signed)
I changed it in Epic from prediabetes to a h/o prediabetes. This should stay in her chart because she does have an increased risk and should be followed for it. Having a history of prediabetes will allow for continued testing. Her recent blood work was 0.1 point away from prediabetes, it definitely needs to be followed.  Her surgeon should be looking at her blood work and should be able to see her recent level and should treat her accordingly.

## 2022-05-05 ENCOUNTER — Other Ambulatory Visit (HOSPITAL_BASED_OUTPATIENT_CLINIC_OR_DEPARTMENT_OTHER): Payer: Self-pay

## 2022-05-07 ENCOUNTER — Encounter (HOSPITAL_COMMUNITY)
Admission: RE | Disposition: A | Discharge status: Home / Self Care | Payer: Self-pay | Source: Ambulatory Visit | Attending: Orthopedic Surgery

## 2022-05-07 ENCOUNTER — Ambulatory Visit (HOSPITAL_COMMUNITY): Payer: BC Managed Care – PPO

## 2022-05-07 ENCOUNTER — Observation Stay (HOSPITAL_COMMUNITY)
Admission: RE | Admit: 2022-05-07 | Discharge: 2022-05-08 | Disposition: A | Discharge status: Home / Self Care | Payer: BC Managed Care – PPO | Source: Ambulatory Visit | Attending: Orthopedic Surgery | Admitting: Orthopedic Surgery

## 2022-05-07 ENCOUNTER — Other Ambulatory Visit: Payer: Self-pay

## 2022-05-07 ENCOUNTER — Encounter: Payer: Self-pay | Admitting: Obstetrics and Gynecology

## 2022-05-07 ENCOUNTER — Ambulatory Visit (HOSPITAL_COMMUNITY): Payer: BC Managed Care – PPO | Admitting: Anesthesiology

## 2022-05-07 ENCOUNTER — Observation Stay (HOSPITAL_COMMUNITY): Payer: BC Managed Care – PPO

## 2022-05-07 ENCOUNTER — Encounter (HOSPITAL_COMMUNITY): Payer: Self-pay | Admitting: Orthopedic Surgery

## 2022-05-07 DIAGNOSIS — I10 Essential (primary) hypertension: Secondary | ICD-10-CM | POA: Insufficient documentation

## 2022-05-07 DIAGNOSIS — Z79899 Other long term (current) drug therapy: Secondary | ICD-10-CM | POA: Insufficient documentation

## 2022-05-07 DIAGNOSIS — F17210 Nicotine dependence, cigarettes, uncomplicated: Secondary | ICD-10-CM | POA: Diagnosis not present

## 2022-05-07 DIAGNOSIS — J45909 Unspecified asthma, uncomplicated: Secondary | ICD-10-CM | POA: Diagnosis not present

## 2022-05-07 DIAGNOSIS — R7303 Prediabetes: Secondary | ICD-10-CM

## 2022-05-07 DIAGNOSIS — M1611 Unilateral primary osteoarthritis, right hip: Secondary | ICD-10-CM | POA: Diagnosis present

## 2022-05-07 HISTORY — PX: TOTAL HIP ARTHROPLASTY: SHX124

## 2022-05-07 LAB — ABO/RH: ABO/RH(D): A POS

## 2022-05-07 SURGERY — ARTHROPLASTY, HIP, TOTAL,POSTERIOR APPROACH
Anesthesia: Spinal | Site: Hip | Laterality: Right

## 2022-05-07 MED ORDER — TRANEXAMIC ACID-NACL 1000-0.7 MG/100ML-% IV SOLN
1000.0000 mg | INTRAVENOUS | Status: AC
  Administered 2022-05-07: 1000 mg via INTRAVENOUS

## 2022-05-07 MED ORDER — METHOCARBAMOL 500 MG IVPB - SIMPLE MED: 500.0000 mg | Freq: Four times a day (QID) | INTRAVENOUS | Status: DC | PRN

## 2022-05-07 MED ORDER — KETOROLAC TROMETHAMINE 30 MG/ML IJ SOLN: 30.0000 mg | Freq: Once | INTRAMUSCULAR | Status: DC | PRN

## 2022-05-07 MED ORDER — ORAL CARE MOUTH RINSE: 15.0000 mL | Freq: Once | OROMUCOSAL | Status: AC

## 2022-05-07 MED ORDER — ACETAMINOPHEN 325 MG PO TABS: 325.0000 mg | ORAL_TABLET | Freq: Four times a day (QID) | ORAL | Status: DC | PRN

## 2022-05-07 MED ORDER — ONDANSETRON HCL 4 MG/2ML IJ SOLN
INTRAMUSCULAR | Status: AC
  Filled 2022-05-07: qty 2

## 2022-05-07 MED ORDER — VITAMIN D 25 MCG (1000 UNIT) PO TABS
2000.0000 [IU] | ORAL_TABLET | Freq: Every day | ORAL | Status: DC
  Administered 2022-05-08: 2000 [IU] via ORAL
  Filled 2022-05-07: qty 2

## 2022-05-07 MED ORDER — OXYCODONE HCL 5 MG PO TABS: 5.0000 mg | ORAL_TABLET | Freq: Once | ORAL | Status: AC | PRN

## 2022-05-07 MED ORDER — PROMETHAZINE HCL 25 MG/ML IJ SOLN: 6.2500 mg | INTRAMUSCULAR | Status: DC | PRN

## 2022-05-07 MED ORDER — DEXAMETHASONE SODIUM PHOSPHATE 10 MG/ML IJ SOLN
INTRAMUSCULAR | Status: AC
  Filled 2022-05-07: qty 1

## 2022-05-07 MED ORDER — CEFAZOLIN SODIUM-DEXTROSE 2-4 GM/100ML-% IV SOLN
2.0000 g | INTRAVENOUS | Status: AC
  Administered 2022-05-07: 2 g via INTRAVENOUS

## 2022-05-07 MED ORDER — SODIUM CHLORIDE (PF) 0.9 % IJ SOLN
INTRAMUSCULAR | Status: AC
  Filled 2022-05-07: qty 10

## 2022-05-07 MED ORDER — PROPOFOL 500 MG/50ML IV EMUL
INTRAVENOUS | Status: DC | PRN
  Administered 2022-05-07: 50 ug/kg/min via INTRAVENOUS

## 2022-05-07 MED ORDER — CELECOXIB 200 MG PO CAPS
ORAL_CAPSULE | ORAL | Status: AC
  Filled 2022-05-07: qty 2

## 2022-05-07 MED ORDER — HYDROMORPHONE HCL 1 MG/ML IJ SOLN
0.5000 mg | INTRAMUSCULAR | Status: DC | PRN
  Administered 2022-05-08: 0.5 mg via INTRAVENOUS
  Filled 2022-05-07: qty 1

## 2022-05-07 MED ORDER — MIDAZOLAM HCL 5 MG/5ML IJ SOLN
INTRAMUSCULAR | Status: DC | PRN
  Administered 2022-05-07 (×2): 1 mg via INTRAVENOUS

## 2022-05-07 MED ORDER — POVIDONE-IODINE 10 % EX SWAB: 2.0000 | Freq: Once | CUTANEOUS | Status: DC

## 2022-05-07 MED ORDER — DOCUSATE SODIUM 100 MG PO CAPS
100.0000 mg | ORAL_CAPSULE | Freq: Two times a day (BID) | ORAL | Status: DC
  Administered 2022-05-07 – 2022-05-08 (×2): 100 mg via ORAL
  Filled 2022-05-07 (×2): qty 1

## 2022-05-07 MED ORDER — PHENYLEPHRINE HCL-NACL 20-0.9 MG/250ML-% IV SOLN
INTRAVENOUS | Status: DC | PRN
  Administered 2022-05-07: 30 ug/min via INTRAVENOUS

## 2022-05-07 MED ORDER — BUPIVACAINE LIPOSOME 1.3 % IJ SUSP
INTRAMUSCULAR | Status: DC | PRN
  Administered 2022-05-07: 20 mL

## 2022-05-07 MED ORDER — ASPIRIN 81 MG PO CHEW
81.0000 mg | CHEWABLE_TABLET | Freq: Two times a day (BID) | ORAL | Status: DC
  Administered 2022-05-08: 81 mg via ORAL
  Filled 2022-05-07: qty 1

## 2022-05-07 MED ORDER — KETOROLAC TROMETHAMINE 15 MG/ML IJ SOLN
15.0000 mg | Freq: Four times a day (QID) | INTRAMUSCULAR | Status: DC
  Administered 2022-05-07 – 2022-05-08 (×3): 15 mg via INTRAVENOUS
  Filled 2022-05-07 (×3): qty 1

## 2022-05-07 MED ORDER — DEXAMETHASONE SODIUM PHOSPHATE 10 MG/ML IJ SOLN
INTRAMUSCULAR | Status: DC | PRN
  Administered 2022-05-07: 10 mg via INTRAVENOUS

## 2022-05-07 MED ORDER — ONDANSETRON HCL 4 MG/2ML IJ SOLN
INTRAMUSCULAR | Status: DC | PRN
  Administered 2022-05-07: 4 mg via INTRAVENOUS

## 2022-05-07 MED ORDER — ACETAMINOPHEN 500 MG PO TABS
ORAL_TABLET | ORAL | Status: AC
  Filled 2022-05-07: qty 2

## 2022-05-07 MED ORDER — PHENOL 1.4 % MT LIQD: 1.0000 | OROMUCOSAL | Status: DC | PRN

## 2022-05-07 MED ORDER — CHLORHEXIDINE GLUCONATE 0.12 % MT SOLN
15.0000 mL | Freq: Once | OROMUCOSAL | Status: AC
  Administered 2022-05-07: 15 mL via OROMUCOSAL

## 2022-05-07 MED ORDER — PHENYLEPHRINE HCL (PRESSORS) 10 MG/ML IV SOLN
INTRAVENOUS | Status: AC
  Filled 2022-05-07: qty 1

## 2022-05-07 MED ORDER — PHENYLEPHRINE HCL (PRESSORS) 10 MG/ML IV SOLN
INTRAVENOUS | Status: DC | PRN
  Administered 2022-05-07: 80 ug via INTRAVENOUS
  Administered 2022-05-07: 100 ug via INTRAVENOUS

## 2022-05-07 MED ORDER — METOPROLOL SUCCINATE ER 50 MG PO TB24
50.0000 mg | ORAL_TABLET | Freq: Every day | ORAL | Status: DC
  Administered 2022-05-08: 50 mg via ORAL
  Filled 2022-05-07: qty 1

## 2022-05-07 MED ORDER — DIPHENHYDRAMINE HCL 12.5 MG/5ML PO ELIX: 12.5000 mg | ORAL_SOLUTION | ORAL | Status: DC | PRN

## 2022-05-07 MED ORDER — OXYCODONE HCL 5 MG PO TABS
ORAL_TABLET | ORAL | Status: AC
  Administered 2022-05-07: 5 mg via ORAL
  Filled 2022-05-07: qty 1

## 2022-05-07 MED ORDER — ROSUVASTATIN CALCIUM 20 MG PO TABS: 20.0000 mg | ORAL_TABLET | Freq: Every day | ORAL | Status: DC

## 2022-05-07 MED ORDER — LIDOCAINE HCL (PF) 2 % IJ SOLN
INTRAMUSCULAR | Status: DC | PRN
  Administered 2022-05-07: 110 mg via INTRADERMAL
  Administered 2022-05-07: 20 mg via INTRADERMAL

## 2022-05-07 MED ORDER — WATER FOR IRRIGATION, STERILE IR SOLN
Status: DC | PRN
  Administered 2022-05-07: 2000 mL

## 2022-05-07 MED ORDER — BUPIVACAINE LIPOSOME 1.3 % IJ SUSP: 10.0000 mL | Freq: Once | INTRAMUSCULAR | Status: DC

## 2022-05-07 MED ORDER — MENTHOL 3 MG MT LOZG: 1.0000 | LOZENGE | OROMUCOSAL | Status: DC | PRN

## 2022-05-07 MED ORDER — METHOCARBAMOL 500 MG PO TABS: 500.0000 mg | ORAL_TABLET | Freq: Four times a day (QID) | ORAL | Status: DC | PRN

## 2022-05-07 MED ORDER — ACETAMINOPHEN 500 MG PO TABS
1000.0000 mg | ORAL_TABLET | Freq: Four times a day (QID) | ORAL | Status: DC
  Administered 2022-05-07 (×2): 1000 mg via ORAL
  Filled 2022-05-07 (×3): qty 2

## 2022-05-07 MED ORDER — TRANEXAMIC ACID-NACL 1000-0.7 MG/100ML-% IV SOLN
INTRAVENOUS | Status: AC
  Filled 2022-05-07: qty 100

## 2022-05-07 MED ORDER — PANTOPRAZOLE SODIUM 40 MG PO TBEC
40.0000 mg | DELAYED_RELEASE_TABLET | Freq: Every day | ORAL | Status: DC
  Administered 2022-05-08: 40 mg via ORAL
  Filled 2022-05-07: qty 1

## 2022-05-07 MED ORDER — LOSARTAN POTASSIUM 50 MG PO TABS
50.0000 mg | ORAL_TABLET | Freq: Every day | ORAL | Status: DC
  Administered 2022-05-08: 50 mg via ORAL
  Filled 2022-05-07: qty 1

## 2022-05-07 MED ORDER — CEFAZOLIN SODIUM-DEXTROSE 2-4 GM/100ML-% IV SOLN
INTRAVENOUS | Status: AC
  Filled 2022-05-07: qty 100

## 2022-05-07 MED ORDER — CEFAZOLIN SODIUM-DEXTROSE 2-4 GM/100ML-% IV SOLN
2.0000 g | Freq: Four times a day (QID) | INTRAVENOUS | Status: AC
  Administered 2022-05-07 – 2022-05-08 (×2): 2 g via INTRAVENOUS
  Filled 2022-05-07 (×2): qty 100

## 2022-05-07 MED ORDER — SODIUM CHLORIDE (PF) 0.9 % IJ SOLN
INTRAMUSCULAR | Status: AC
  Filled 2022-05-07: qty 50

## 2022-05-07 MED ORDER — SODIUM CHLORIDE 0.9 % IR SOLN
Status: DC | PRN
  Administered 2022-05-07: 3000 mL

## 2022-05-07 MED ORDER — ACETAMINOPHEN 500 MG PO TABS
1000.0000 mg | ORAL_TABLET | Freq: Once | ORAL | Status: AC
  Administered 2022-05-07: 1000 mg via ORAL

## 2022-05-07 MED ORDER — ONDANSETRON HCL 4 MG PO TABS: 4.0000 mg | ORAL_TABLET | Freq: Four times a day (QID) | ORAL | Status: DC | PRN

## 2022-05-07 MED ORDER — 0.9 % SODIUM CHLORIDE (POUR BTL) OPTIME
TOPICAL | Status: DC | PRN
  Administered 2022-05-07: 1000 mL

## 2022-05-07 MED ORDER — POLYETHYLENE GLYCOL 3350 17 G PO PACK: 17.0000 g | PACK | Freq: Every day | ORAL | Status: DC | PRN

## 2022-05-07 MED ORDER — ONDANSETRON HCL 4 MG/2ML IJ SOLN: 4.0000 mg | Freq: Four times a day (QID) | INTRAMUSCULAR | Status: DC | PRN

## 2022-05-07 MED ORDER — HYDROMORPHONE HCL 1 MG/ML IJ SOLN
0.2500 mg | INTRAMUSCULAR | Status: DC | PRN
  Administered 2022-05-07: 0.5 mg via INTRAVENOUS

## 2022-05-07 MED ORDER — VITAMIN B-12 1000 MCG PO TABS
1000.0000 ug | ORAL_TABLET | Freq: Every day | ORAL | Status: DC
  Filled 2022-05-07: qty 1

## 2022-05-07 MED ORDER — MIDAZOLAM HCL 2 MG/2ML IJ SOLN
INTRAMUSCULAR | Status: AC
  Filled 2022-05-07: qty 2

## 2022-05-07 MED ORDER — METHOCARBAMOL 500 MG IVPB - SIMPLE MED
INTRAVENOUS | Status: AC
  Administered 2022-05-07: 500 mg via INTRAVENOUS
  Filled 2022-05-07: qty 55

## 2022-05-07 MED ORDER — PROPOFOL 1000 MG/100ML IV EMUL
INTRAVENOUS | Status: AC
  Filled 2022-05-07: qty 100

## 2022-05-07 MED ORDER — MEPERIDINE HCL 50 MG/ML IJ SOLN: 6.2500 mg | INTRAMUSCULAR | Status: DC | PRN

## 2022-05-07 MED ORDER — BUPIVACAINE LIPOSOME 1.3 % IJ SUSP
INTRAMUSCULAR | Status: AC
  Filled 2022-05-07: qty 20

## 2022-05-07 MED ORDER — GABAPENTIN 300 MG PO CAPS: 300.0000 mg | ORAL_CAPSULE | Freq: Two times a day (BID) | ORAL | Status: DC | PRN

## 2022-05-07 MED ORDER — HYDROCODONE-ACETAMINOPHEN 10-325 MG PO TABS
1.0000 | ORAL_TABLET | ORAL | Status: DC | PRN
  Administered 2022-05-07 – 2022-05-08 (×2): 1 via ORAL
  Filled 2022-05-07 (×2): qty 1

## 2022-05-07 MED ORDER — DULOXETINE HCL 30 MG PO CPEP
30.0000 mg | ORAL_CAPSULE | Freq: Every day | ORAL | Status: DC
  Administered 2022-05-08: 30 mg via ORAL
  Filled 2022-05-07: qty 1

## 2022-05-07 MED ORDER — LACTATED RINGERS IV SOLN: INTRAVENOUS | Status: DC

## 2022-05-07 MED ORDER — OXYCODONE HCL 5 MG/5ML PO SOLN: 5.0000 mg | Freq: Once | ORAL | Status: AC | PRN

## 2022-05-07 MED ORDER — SODIUM CHLORIDE 0.9 % IV SOLN: INTRAVENOUS | Status: DC

## 2022-05-07 MED ORDER — SODIUM CHLORIDE (PF) 0.9 % IJ SOLN
INTRAMUSCULAR | Status: DC | PRN
  Administered 2022-05-07: 60 mL

## 2022-05-07 MED ORDER — HYDROMORPHONE HCL 1 MG/ML IJ SOLN
INTRAMUSCULAR | Status: AC
  Administered 2022-05-07: 0.5 mg via INTRAVENOUS
  Filled 2022-05-07: qty 1

## 2022-05-07 MED ORDER — SENNA 8.6 MG PO TABS
1.0000 | ORAL_TABLET | Freq: Two times a day (BID) | ORAL | Status: DC
  Administered 2022-05-07 – 2022-05-08 (×2): 8.6 mg via ORAL
  Filled 2022-05-07 (×2): qty 1

## 2022-05-07 MED ORDER — BUPIVACAINE IN DEXTROSE 0.75-8.25 % IT SOLN
INTRATHECAL | Status: DC | PRN
  Administered 2022-05-07: 1.6 mL via INTRATHECAL

## 2022-05-07 MED ORDER — CELECOXIB 200 MG PO CAPS
400.0000 mg | ORAL_CAPSULE | Freq: Once | ORAL | Status: AC
  Administered 2022-05-07: 400 mg via ORAL

## 2022-05-07 SURGICAL SUPPLY — 73 items
ADH SKN CLS APL DERMABOND .7 (GAUZE/BANDAGES/DRESSINGS) ×2
APL PRP STRL LF DISP 70% ISPRP (MISCELLANEOUS) ×2
BAG COUNTER SPONGE SURGICOUNT (BAG) IMPLANT
BAG DECANTER FOR FLEXI CONT (MISCELLANEOUS) ×1 IMPLANT
BAG SPEC THK2 15X12 ZIP CLS (MISCELLANEOUS) ×1
BAG SPNG CNTER NS LX DISP (BAG)
BAG ZIPLOCK 12X15 (MISCELLANEOUS) ×1 IMPLANT
BLADE SAW SAG 25X90X1.19 (BLADE) ×1 IMPLANT
CHLORAPREP W/TINT 26 (MISCELLANEOUS) ×2 IMPLANT
COVER SURGICAL LIGHT HANDLE (MISCELLANEOUS) ×1 IMPLANT
DERMABOND ADVANCED .7 DNX12 (GAUZE/BANDAGES/DRESSINGS) ×1 IMPLANT
DRAPE HIP W/POCKET STRL (MISCELLANEOUS) ×1 IMPLANT
DRAPE INCISE IOBAN 66X45 STRL (DRAPES) ×1 IMPLANT
DRAPE INCISE IOBAN 85X60 (DRAPES) ×1 IMPLANT
DRAPE POUCH INSTRU U-SHP 10X18 (DRAPES) ×1 IMPLANT
DRAPE SHEET LG 3/4 BI-LAMINATE (DRAPES) ×3 IMPLANT
DRAPE SURG 17X11 SM STRL (DRAPES) ×1 IMPLANT
DRAPE U-SHAPE 47X51 STRL (DRAPES) ×2 IMPLANT
DRESSING AQUACEL AG SP 3.5X10 (GAUZE/BANDAGES/DRESSINGS) ×1 IMPLANT
DRSG AQUACEL AG SP 3.5X10 (GAUZE/BANDAGES/DRESSINGS) ×1
ELECT BLADE TIP CTD 4 INCH (ELECTRODE) ×1 IMPLANT
ELECT REM PT RETURN 15FT ADLT (MISCELLANEOUS) ×1 IMPLANT
GLOVE BIO SURGEON STRL SZ 6.5 (GLOVE) ×2 IMPLANT
GLOVE BIOGEL PI IND STRL 6.5 (GLOVE) ×1 IMPLANT
GLOVE BIOGEL PI IND STRL 8 (GLOVE) ×1 IMPLANT
GLOVE SURG ORTHO 8.0 STRL STRW (GLOVE) ×2 IMPLANT
GOWN STRL REUS W/ TWL XL LVL3 (GOWN DISPOSABLE) ×2 IMPLANT
GOWN STRL REUS W/TWL XL LVL3 (GOWN DISPOSABLE) ×2
HANDPIECE INTERPULSE COAX TIP (DISPOSABLE)
HEAD BIOLOX HIP 36/-2.5 (Joint) IMPLANT
HIP BIOLOX HD 36/-2.5 (Joint) ×1 IMPLANT
HOLDER FOLEY CATH W/STRAP (MISCELLANEOUS) ×1 IMPLANT
HOOD PEEL AWAY T7 (MISCELLANEOUS) ×3 IMPLANT
INSERT 0 DEGREE 36 (Miscellaneous) IMPLANT
JET LAVAGE IRRISEPT WOUND (IRRIGATION / IRRIGATOR) ×1
KIT BASIN OR (CUSTOM PROCEDURE TRAY) ×1 IMPLANT
KIT TURNOVER KIT A (KITS) IMPLANT
LAVAGE JET IRRISEPT WOUND (IRRIGATION / IRRIGATOR) IMPLANT
MANIFOLD NEPTUNE II (INSTRUMENTS) ×1 IMPLANT
MARKER SKIN DUAL TIP RULER LAB (MISCELLANEOUS) ×1 IMPLANT
NDL HYPO 22X1.5 SAFETY MO (MISCELLANEOUS) IMPLANT
NDL SAFETY ECLIP 18X1.5 (MISCELLANEOUS) IMPLANT
NEEDLE HYPO 22X1.5 SAFETY MO (MISCELLANEOUS) IMPLANT
NEEDLE SAFETY HYPO 22GAX1.5 (MISCELLANEOUS)
NS IRRIG 1000ML POUR BTL (IV SOLUTION) ×1 IMPLANT
PACK TOTAL JOINT (CUSTOM PROCEDURE TRAY) ×1 IMPLANT
PRESSURIZER FEMORAL UNIV (MISCELLANEOUS) IMPLANT
PROTECTOR NERVE ULNAR (MISCELLANEOUS) ×1 IMPLANT
RETRIEVER SUT HEWSON (MISCELLANEOUS) ×1 IMPLANT
SCREW HEX LP 6.5X15 (Screw) IMPLANT
SCREW HEX LP 6.5X30 (Screw) IMPLANT
SEALER BIPOLAR AQUA 6.0 (INSTRUMENTS) ×1 IMPLANT
SET HNDPC FAN SPRY TIP SCT (DISPOSABLE) IMPLANT
SHELL TRIDENT II CLUST 50 (Shell) IMPLANT
SPIKE FLUID TRANSFER (MISCELLANEOUS) ×3 IMPLANT
STEM HIP 127 DEG (Stem) IMPLANT
SUCTION FRAZIER HANDLE 12FR (TUBING) ×1
SUCTION TUBE FRAZIER 12FR DISP (TUBING) ×1 IMPLANT
SUT BONE WAX W31G (SUTURE) ×1 IMPLANT
SUT ETHIBOND #5 BRAIDED 30INL (SUTURE) ×1 IMPLANT
SUT MNCRL AB 3-0 PS2 18 (SUTURE) ×1 IMPLANT
SUT STRATAFIX 0 PDS 27 VIOLET (SUTURE) ×1
SUT STRATAFIX PDO 1 14 VIOLET (SUTURE) ×1
SUT STRATFX PDO 1 14 VIOLET (SUTURE) ×1
SUT VIC AB 2-0 CT2 27 (SUTURE) ×2 IMPLANT
SUTURE STRATFX 0 PDS 27 VIOLET (SUTURE) ×1 IMPLANT
SUTURE STRATFX PDO 1 14 VIOLET (SUTURE) ×1 IMPLANT
SYR 20ML LL LF (SYRINGE) ×2 IMPLANT
TOWEL OR 17X26 10 PK STRL BLUE (TOWEL DISPOSABLE) ×1 IMPLANT
TRAY FOLEY MTR SLVR 16FR STAT (SET/KITS/TRAYS/PACK) ×1 IMPLANT
TUBE SUCTION HIGH CAP CLEAR NV (SUCTIONS) IMPLANT
UNDERPAD 30X36 HEAVY ABSORB (UNDERPADS AND DIAPERS) ×1 IMPLANT
WATER STERILE IRR 1000ML POUR (IV SOLUTION) ×2 IMPLANT

## 2022-05-07 NOTE — Interval H&P Note (Signed)
The patient has been re-examined, and the chart reviewed, and there have been no interval changes to the documented history and physical.    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   The risks, benefits, and alternatives have been discussed at length with patient, and the patient is willing to proceed.  Right hip marked. Consent has been signed.

## 2022-05-07 NOTE — Anesthesia Preprocedure Evaluation (Signed)
Anesthesia Evaluation  Patient identified by MRN, date of birth, ID band Patient awake    Reviewed: Allergy & Precautions, NPO status , Patient's Chart, lab work & pertinent test results  Airway Mallampati: I       Dental no notable dental hx.    Pulmonary Current Smoker and Patient abstained from smoking.   Pulmonary exam normal        Cardiovascular hypertension, Pt. on medications and Pt. on home beta blockers Normal cardiovascular exam     Neuro/Psych    GI/Hepatic   Endo/Other    Renal/GU      Musculoskeletal   Abdominal  (+) + obese  Peds  Hematology   Anesthesia Other Findings   Reproductive/Obstetrics                             Anesthesia Physical Anesthesia Plan  ASA: 2  Anesthesia Plan: Spinal   Post-op Pain Management:    Induction:   PONV Risk Score and Plan: 1  Airway Management Planned: Natural Airway and Simple Face Mask  Additional Equipment: None  Intra-op Plan:   Post-operative Plan:   Informed Consent: I have reviewed the patients History and Physical, chart, labs and discussed the procedure including the risks, benefits and alternatives for the proposed anesthesia with the patient or authorized representative who has indicated his/her understanding and acceptance.       Plan Discussed with: CRNA  Anesthesia Plan Comments:        Anesthesia Quick Evaluation

## 2022-05-07 NOTE — Plan of Care (Signed)
  Problem: Activity: Goal: Ability to avoid complications of mobility impairment will improve Outcome: Progressing Goal: Ability to tolerate increased activity will improve Outcome: Progressing   Problem: Pain Management: Goal: Pain level will decrease with appropriate interventions Outcome: Progressing   Problem: Nutrition: Goal: Adequate nutrition will be maintained Outcome: Progressing   Problem: Safety: Goal: Ability to remain free from injury will improve Outcome: Progressing

## 2022-05-07 NOTE — Op Note (Addendum)
05/07/2022  3:45 PM  PATIENT:  Angela West   MRN: [id number]  PRE-OPERATIVE DIAGNOSIS:   POST-OPERATIVE DIAGNOSIS:  same  PROCEDURE:  Procedure(s): TOTAL HIP ARTHROPLASTY  PREOPERATIVE INDICATIONS:    Angela West is an 67 y.o. female who has a diagnosis of End right stage osteoarthritis and elected for surgical management after failing conservative treatment.  The risks benefits and alternatives were discussed with the patient including but not limited to the risks of nonoperative treatment, versus surgical intervention including infection, bleeding, nerve injury, periprosthetic fracture, the need for revision surgery, dislocation, leg length discrepancy, blood clots, cardiopulmonary complications, morbidity, mortality, among others, and they were willing to proceed.     OPERATIVE REPORT     SURGEON:  Charlies Constable, MD    ASSISTANT: Dorise Bullion, PA-C, (Present throughout the entire procedure,  necessary for completion of procedure in a timely manner, assisting with retraction, instrumentation, and closure)     ANESTHESIA: Spinal  ESTIMATED BLOOD LOSS: 0000000    COMPLICATIONS:  None.     COMPONENTS:   Stryker Trident 250 mm acetabular shell, X.3 neutral liner, 6.5 hex screws x 2, size 5 Accolade 2 femoral hip stem with 127 degree neck angle, 36 mm -2.5 mm ceramic head Implant Name Type Inv. Item Serial No. Manufacturer Lot No. LRB No. Used Action  SHELL TRIDENT II CLUST 50 - [id number] Shell SHELL TRIDENT II CLUST 50  [employer] [id number] A Right 1 Implanted  SCREW HEX LP 6.5X15 - [id number] Screw SCREW HEX LP 6.5X15  [employer] [place] Right 1 Implanted  INSERT 0 DEGREE 36 - [id number] Miscellaneous INSERT 0 DEGREE 36  [employer] [id number] Right 1 Implanted  SCREW HEX LP 6.5X30 - [id number] Screw SCREW HEX LP 6.5X30  [employer] [id number] Right 1 Implanted  STEM HIP 127 DEG - [id number] Stem STEM HIP 127 DEG  [employer] [id number] A Right 1  Implanted  HIP BIOLOX HD 36/-2.5 - [id number] Joint HIP BIOLOX HD 36/-2.5  [employer] [id number] Right 1 Implanted      PROCEDURE IN DETAIL:   The patient was met in the holding area and  identified.  The appropriate hip was identified and marked at the operative site.  The patient was then transported to the OR  and  placed under anesthesia.  At that point, the patient was  placed in the lateral decubitus position with the operative side up and  secured to the operating room table  and all bony prominences padded. A subaxillary role was also placed.    The operative lower extremity was prepped from the iliac crest to the distal leg.  Sterile draping was performed.  Preoperative antibiotics, 2 gm of ancef,1 gm of Tranexamic Acid, and 8 mg of Decadron administered. Time out was performed prior to incision.      A routine posterolateral approach was utilized via sharp dissection  carried down to the subcutaneous tissue.  Gross bleeders were Bovie coagulated.  The iliotibial band was identified and incised along the length of the skin incision through the glute max fascia.  Charnley retractor was placed with care to protect the sciatic nerve posteriorly.  With the hip internally rotated, the piriformis tendon was identified and released from the femoral insertion and tagged with a #5 Ethibond.  A capsulotomy was then performed off the femoral insertion and also tagged with a #5 Ethibond.    The femoral neck was exposed, and I resected the femoral neck based on preoperative templating relative to the lesser trochanter.  I then exposed the deep acetabulum, cleared out any tissue including the ligamentum teres.  After adequate visualization, I excised the labrum.  I then started reaming with a 44 mm reamer, first medializing to the floor of the cotyloid fossa, and then in the position of the cup aiming towards the greater sciatic notch, matching the version of the transverse acetabular ligament and  tucked under the anterior wall. I reamed up to 50 mm reamer with good bony bed preparation and a 50 mm cup was chosen.  The real cup was then impacted into place.  Appropriate version and inclination was confirmed clinically matching their bony anatomy, and also with the use of the jig.  I placed 2 screws in the posterior superior quadrant to augment fixation.  A neutral liner was placed and impacted. It was confirmed to be appropriately seated and the acetabular retractors were removed.    I then prepared the proximal femur using the box cutter, Charnley awl, and then sequentially broached starting with 0 up to a size 5.  A trial broach, neck, and head was utilized, and I reduced the hip and it was found to have excellent stability.  There was no impingement with full extension and 90 degrees external rotation.  The hip was stable at the position of sleep and with 90 degrees flexion and 90degrees of internal rotation.  Leg lengths were also clinically assessed in the lateral position and felt to be equal. Intra-Op flatplate was obtained and confirmed appropriate component positions.  Good fill of the femur with the size 5 broach.  And restoration of leg length and offset. No evidence or concern for fracture.  A final femoral prosthesis size 5 was selected. I then impacted the real femoral prosthesis into place.I again trialed and selected a 36 -2.5 mm ball. The hip was then reduced and taken through a range of motion. There was no impingement with full extension and 90 degrees external rotation.  The hip was stable at the position of sleep and with 90 degrees flexion and 90degrees of internal rotation. Leg lengths were  again assessed and felt to be restored.  We then opened, and I impacted the real head ball into place.  The posterior capsule was then closed with #5 Ethibond.  The piriformis was repaired through the base of the abductor tendon using a [place] suture passer.  I then irrigated the hip  copiously with irrisept and prontosan irrigation and with normal saline pulse lavage. Periarticular injection was then performed with Exparel.   We repaired the fascia #1 barbed suture, followed by 0 barbed suture for the subcutaneous fat.  Skin was closed with 2-0 Vicryl and 3-0 Monocryl.  Dermabond and Aquacel dressing were applied. The patient was then awakened and returned to PACU in stable and satisfactory condition.  Leg lengths in the supine position were assessed and felt to be clinically equal. There were no complications.  Post op recs: WB: WBAT RLE, No formal hip precautions Abx: ancef Imaging: PACU pelvis Xray Dressing: Aquacell, keep intact until follow up DVT prophylaxis: Aspirin 81BID starting POD1 Follow up: 2 weeks after surgery for a wound check with Dr. Zachery Dakins at [facility].  Address: [address], [place], [place] [address]  Office Phone: [phone]   Charlies Constable, MD Orthopedic Surgeon

## 2022-05-07 NOTE — Anesthesia Postprocedure Evaluation (Signed)
Anesthesia Post Note  Patient: Angela West  Procedure(s) Performed: TOTAL HIP ARTHROPLASTY (Right: Hip)     Patient location during evaluation: PACU Anesthesia Type: Spinal Level of consciousness: awake and sedated Pain management: pain level controlled Vital Signs Assessment: post-procedure vital signs reviewed and stable Respiratory status: spontaneous breathing Cardiovascular status: stable Postop Assessment: no headache, no backache, spinal receding, patient able to bend at knees and no apparent nausea or vomiting Anesthetic complications: no  No notable events documented.  Last Vitals:  Vitals:   05/07/22 1700 05/07/22 1715  BP: 130/68 127/64  Pulse: 71 71  Resp: 13 16  Temp:    SpO2: 96% 98%    Last Pain:  Vitals:   05/07/22 1715  TempSrc:   PainSc: 4     LLE Motor Response: Purposeful movement (05/07/22 1715) LLE Sensation: Decreased;Numbness (05/07/22 1715) RLE Motor Response: Purposeful movement (05/07/22 1715) RLE Sensation: Decreased;Numbness (05/07/22 1715) L Sensory Level: S1-Sole of foot, small toes (05/07/22 1715) R Sensory Level: S1-Sole of foot, small toes (05/07/22 1715)  Huston Foley

## 2022-05-07 NOTE — Transfer of Care (Signed)
Immediate Anesthesia Transfer of Care Note  Patient: Angela West  Procedure(s) Performed: TOTAL HIP ARTHROPLASTY (Right: Hip)  Patient Location: PACU  Anesthesia Type:Spinal  Level of Consciousness: awake, alert , oriented, and patient cooperative  Airway & Oxygen Therapy: Patient Spontanous Breathing and Patient connected to face mask oxygen  Post-op Assessment: Report given to RN and Post -op Vital signs reviewed and stable  Post vital signs: Reviewed and stable  Last Vitals:  Vitals Value Taken Time  BP 116/65 05/07/22 1646  Temp    Pulse 70 05/07/22 1650  Resp 20 05/07/22 1650  SpO2 100 % 05/07/22 1650  Vitals shown include unvalidated device data.  Last Pain:  Vitals:   05/07/22 1111  TempSrc:   PainSc: 0-No pain         Complications: No notable events documented.

## 2022-05-07 NOTE — Anesthesia Procedure Notes (Signed)
Spinal  Patient location during procedure: OR Start time: 05/07/2022 2:00 PM End time: 05/07/2022 2:05 PM Reason for block: surgical anesthesia Staffing Performed: resident/CRNA  Resident/CRNA: Garrel Ridgel, CRNA Performed by: Randye Lobo, CRNA Authorized by: Lyn Hollingshead, MD   Preanesthetic Checklist Completed: patient identified, IV checked, site marked, risks and benefits discussed, surgical consent, monitors and equipment checked, pre-op evaluation and timeout performed Spinal Block Patient position: sitting Prep: ChloraPrep Patient monitoring: heart rate, cardiac monitor, continuous pulse ox and blood pressure Approach: midline Location: L4-5 Injection technique: single-shot Needle Needle type: Pencan  Needle gauge: 24 G Needle length: 9 cm Needle insertion depth: 4 cm Assessment Sensory level: T4 Events: CSF return

## 2022-05-07 NOTE — Discharge Instructions (Signed)

## 2022-05-08 ENCOUNTER — Encounter (HOSPITAL_COMMUNITY): Payer: Self-pay | Admitting: Orthopedic Surgery

## 2022-05-08 DIAGNOSIS — M1611 Unilateral primary osteoarthritis, right hip: Secondary | ICD-10-CM | POA: Diagnosis not present

## 2022-05-08 LAB — CBC
HCT: 31 % — ABNORMAL LOW (ref 36.0–46.0)
Hemoglobin: 11 g/dL — ABNORMAL LOW (ref 12.0–15.0)
MCH: 33.5 pg (ref 26.0–34.0)
MCHC: 35.5 g/dL (ref 30.0–36.0)
MCV: 94.5 fL (ref 80.0–100.0)
Platelets: 215 10*3/uL (ref 150–400)
RBC: 3.28 MIL/uL — ABNORMAL LOW (ref 3.87–5.11)
RDW: 12.5 % (ref 11.5–15.5)
WBC: 13.2 10*3/uL — ABNORMAL HIGH (ref 4.0–10.5)
nRBC: 0 % (ref 0.0–0.2)

## 2022-05-08 LAB — BASIC METABOLIC PANEL
Anion gap: 8 (ref 5–15)
BUN: 21 mg/dL (ref 8–23)
CO2: 24 mmol/L (ref 22–32)
Calcium: 8.6 mg/dL — ABNORMAL LOW (ref 8.9–10.3)
Chloride: 102 mmol/L (ref 98–111)
Creatinine, Ser: 0.86 mg/dL (ref 0.44–1.00)
GFR, Estimated: >60 mL/min (ref >60)
Glucose, Bld: 129 mg/dL — ABNORMAL HIGH (ref 70–99)
Potassium: 4.1 mmol/L (ref 3.5–5.1)
Sodium: 134 mmol/L — ABNORMAL LOW (ref 135–145)

## 2022-05-08 MED ORDER — MELOXICAM 15 MG PO TABS: 15.0000 mg | ORAL_TABLET | Freq: Every day | ORAL | 0 refills | Status: DC

## 2022-05-08 MED ORDER — ASPIRIN 81 MG PO TBEC: 81.0000 mg | DELAYED_RELEASE_TABLET | Freq: Two times a day (BID) | ORAL | 0 refills | Status: AC

## 2022-05-08 MED ORDER — ACETAMINOPHEN 500 MG PO TABS: 1000.0000 mg | ORAL_TABLET | Freq: Three times a day (TID) | ORAL | 0 refills | Status: AC | PRN

## 2022-05-08 MED ORDER — ONDANSETRON HCL 4 MG PO TABS: 4.0000 mg | ORAL_TABLET | Freq: Three times a day (TID) | ORAL | 0 refills | Status: AC | PRN

## 2022-05-08 NOTE — Plan of Care (Signed)
Pt ready to DC home with family. 

## 2022-05-08 NOTE — Progress Notes (Signed)
Physical Therapy Treatment Patient Details Name: Angela West MRN: [id number] DOB: 03-02-1956 Today's Date: 05/08/2022   History of Present Illness Pt s/p R THR    PT Comments    Pt progressing well with mobility and up to ambulate in hall, negotiated stairs, reviewed car transfers, reviewed written HEP, and with multiple questions asked and answered.  Pt eager for dc home this date.   Recommendations for follow up therapy are one component of a multi-disciplinary discharge planning process, led by the attending physician.  Recommendations may be updated based on patient status, additional functional criteria and insurance authorization.  Follow Up Recommendations  Follow physician's recommendations for discharge plan and follow up therapies     Assistance Recommended at Discharge Intermittent Supervision/Assistance  Patient can return home with the following A little help with walking and/or transfers;A little help with bathing/dressing/bathroom;Assistance with cooking/housework;Assist for transportation;Help with stairs or ramp for entrance   Equipment Recommendations  Rolling walker (2 wheels)    Recommendations for Other Services       Precautions / Restrictions Precautions Precautions: Fall Precaution Comments: No THP per physician order Restrictions Weight Bearing Restrictions: No Other Position/Activity Restrictions: WBAT     Mobility  Bed Mobility Overal bed mobility: Needs Assistance Bed Mobility: Supine to Sit, Sit to Supine     Supine to sit: Supervision Sit to supine: Min guard, Supervision   General bed mobility comments: increased time with min VC and no physical assist    Transfers Overall transfer level: Needs assistance Equipment used: Rolling walker (2 wheels) Transfers: Sit to/from Stand Sit to Stand: Supervision           General transfer comment: cues for LE management and use of UEs to self assist    Ambulation/Gait Ambulation/Gait  assistance: Min guard, Supervision Gait Distance (Feet): 84 Feet Assistive device: Rolling walker (2 wheels) Gait Pattern/deviations: Step-to pattern, Step-through pattern, Decreased step length - right, Decreased step length - left, Shuffle, Trunk flexed Gait velocity: decr     General Gait Details: cues for posture, position from RW and initial sequence   Stairs Stairs: Yes Stairs assistance: Min assist Stair Management: No rails, One rail Left, Step to pattern, Forwards, Backwards, With walker Number of Stairs: 7 General stair comments: single step twice bkwd with RW; 5 stairs with rail and cane; cues for sequence   Wheelchair Mobility    Modified Rankin (Stroke Patients Only)       Balance Overall balance assessment: Mild deficits observed, not formally tested                                          Cognition Arousal/Alertness: Awake/alert Behavior During Therapy: WFL for tasks assessed/performed Overall Cognitive Status: Within Functional Limits for tasks assessed                                          Exercises Total Joint Exercises Ankle Circles/Pumps: AROM, Both, 15 reps, Supine Quad Sets: AROM, Both, 10 reps, Supine Heel Slides: AAROM, Right, 20 reps, Supine Hip ABduction/ADduction: AAROM, Left, 15 reps, Supine Long Arc Quad: AAROM, Left, 10 reps, Seated    General Comments        Pertinent Vitals/Pain Pain Assessment Pain Assessment: 0-10 Pain Score: 3  Pain Location: R hip Pain Descriptors /  Indicators: Aching, Sore Pain Intervention(s): Limited activity within patient's tolerance, Monitored during session, Premedicated before session    Home Living Family/patient expects to be discharged to:: Private residence Living Arrangements: Spouse/significant other Available Help at Discharge: Family Type of Home: House Home Access: Stairs to enter Entrance Stairs-Rails: Psychiatric nurse of Steps:  3+1 Alternate Level Stairs-Number of Steps: FLIGHT Home Layout: Two level Home Equipment: Cane - single point      Prior Function            PT Goals (current goals can now be found in the care plan section) Acute Rehab PT Goals Patient Stated Goal: Regain IND PT Goal Formulation: With patient Time For Goal Achievement: 05/15/22 Potential to Achieve Goals: Good Progress towards PT goals: Progressing toward goals    Frequency    7X/week      PT Plan Current plan remains appropriate    Co-evaluation              AM-PAC PT "6 Clicks" Mobility   Outcome Measure  Help needed turning from your back to your side while in a flat bed without using bedrails?: A Little Help needed moving from lying on your back to sitting on the side of a flat bed without using bedrails?: A Little Help needed moving to and from a bed to a chair (including a wheelchair)?: A Little Help needed standing up from a chair using your arms (e.g., wheelchair or bedside chair)?: A Little Help needed to walk in hospital room?: A Little Help needed climbing 3-5 steps with a railing? : A Little 6 Click Score: 18    End of Session Equipment Utilized During Treatment: Gait belt Activity Tolerance: Patient tolerated treatment well Patient left: in chair;with call bell/phone within reach;with chair alarm set Nurse Communication: Mobility status PT Visit Diagnosis: Difficulty in walking, not elsewhere classified (R26.2)     Time: [id number] PT Time Calculation (min) (ACUTE ONLY): 26 min  Charges:  $Gait Training: 8-22 mins $Therapeutic Exercise: 8-22 mins $Therapeutic Activity: 8-22 mins                     [place] Pager [phone] Office [phone]    Snow Peoples 05/08/2022, 12:46 PM

## 2022-05-08 NOTE — Discharge Summary (Signed)
Physician Discharge Summary  Patient ID: Angela West MRN: [id number] DOB/AGE: 65-Jan-1957 65 y.o.  Admit date: 05/07/2022 Discharge date: 05/08/2022  Admission Diagnoses:  Osteoarthritis of right hip, unspecified osteoarthritis type  Discharge Diagnoses:  Principal Problem:   Osteoarthritis of right hip, unspecified osteoarthritis type   Past Medical History:  Diagnosis Date   Abnormal Pap smear 10/1993   ASCUS   Anemia    Arthritis    Asthma    seasonal   Complication of anesthesia    Novacaine   Fibroids 12/2010   numerous   Gastritis    HPV in female    Hypercholesterolemia    Hypertension    IBS (irritable bowel syndrome)    Insomnia    Peptic ulcer    Pre-diabetes    hx of   Smoker     Surgeries: Procedure(s): TOTAL HIP ARTHROPLASTY on 05/07/2022   Consultants (if any):   Discharged Condition: Improved  Hospital Course: Angela West is an 67 y.o. female who was admitted 05/07/2022 with a diagnosis of Osteoarthritis of right hip, unspecified osteoarthritis type and went to the operating room on 05/07/2022 and underwent the above named procedures.    She was given perioperative antibiotics:  Anti-infectives (From admission, onward)    Start     Dose/Rate Route Frequency Ordered Stop   05/07/22 2000  ceFAZolin (ANCEF) IVPB 2g/100 mL premix        2 g 200 mL/hr over 30 Minutes Intravenous Every 6 hours 05/07/22 1749 05/08/22 0144   05/07/22 1045  ceFAZolin (ANCEF) IVPB 2g/100 mL premix        2 g 200 mL/hr over 30 Minutes Intravenous On call to O.R. 05/07/22 1033 05/07/22 1409   05/07/22 1038  ceFAZolin (ANCEF) 2-4 GM/100ML-% IVPB       Note to Pharmacy: Guadelupe Sabin B: cabinet override      05/07/22 1038 05/07/22 1408     .  She was given sequential compression devices, early ambulation, and aspirin for DVT prophylaxis.  She benefited maximally from the hospital stay and there were no complications.    Recent vital signs:  Vitals:   05/08/22 0528 05/08/22  0849  BP: 120/63 128/73  Pulse: 82 86  Resp: 18 18  Temp: 97.9 F (36.6 C) 97.9 F (36.6 C)  SpO2: 97% 99%    Recent laboratory studies:  Lab Results  Component Value Date   HGB 11.0 (L) 05/08/2022   HGB 14.5 04/24/2022   HGB 14.9 12/11/2017   Lab Results  Component Value Date   WBC 13.2 (H) 05/08/2022   PLT 215 05/08/2022   No results found for: "INR" Lab Results  Component Value Date   NA 134 (L) 05/08/2022   K 4.1 05/08/2022   CL 102 05/08/2022   CO2 24 05/08/2022   BUN 21 05/08/2022   CREATININE 0.86 05/08/2022   GLUCOSE 129 (H) 05/08/2022    Discharge Medications:   Allergies as of 05/08/2022       Reactions   Erythromycin Nausea And Vomiting   Penicillins Hives        Medication List     TAKE these medications    acetaminophen 500 MG tablet Commonly known as: TYLENOL Take 2 tablets (1,000 mg total) by mouth every 8 (eight) hours as needed.   aspirin EC 81 MG tablet Take 1 tablet (81 mg total) by mouth 2 (two) times daily for 28 days. Swallow whole. Start taking on: May 09, 2022   cyanocobalamin 1000  MCG tablet Commonly known as: VITAMIN B12 Take 1,000 mcg by mouth daily.   cyclobenzaprine 10 MG tablet Commonly known as: FLEXERIL Take 10 mg by mouth 3 (three) times daily as needed for muscle spasms.   DULoxetine 30 MG capsule Commonly known as: CYMBALTA Take 30 mg by mouth daily.   gabapentin 300 MG capsule Commonly known as: NEURONTIN Take 300-600 mg by mouth 2 (two) times daily as needed (restless leg).   HYDROcodone-acetaminophen 10-325 MG tablet Commonly known as: NORCO Take 1 tablet by mouth every 6 (six) hours as needed.   HYDROcodone-acetaminophen 10-325 MG tablet Commonly known as: NORCO Take 1 tablet by mouth every 4 (four) to 6 (six) hours as needed for pain   losartan 50 MG tablet Commonly known as: COZAAR Take 50 mg by mouth daily.   meloxicam 15 MG tablet Commonly known as: MOBIC Take 1 tablet (15 mg total) by  mouth daily.   metoprolol succinate 50 MG 24 hr tablet Commonly known as: TOPROL-XL Take 50 mg by mouth daily.   ondansetron 4 MG tablet Commonly known as: Zofran Take 1 tablet (4 mg total) by mouth every 8 (eight) hours as needed for up to 14 days for nausea or vomiting.   promethazine 25 MG tablet Commonly known as: PHENERGAN Take 25 mg by mouth every 6 (six) hours as needed for nausea or vomiting.   rosuvastatin 20 MG tablet Commonly known as: CRESTOR Take 20 mg by mouth daily.   tirzepatide 7.5 MG/0.5ML Pen Commonly known as: ZEPBOUND Inject 7.5 mg into the skin once a week.   Vitamin D3 50 MCG (2000 UT) Caps Generic drug: Cholecalciferol Take 2,000 Units by mouth daily.        Diagnostic Studies: DG HIP UNILAT W OR W/O PELVIS 2-3 VIEWS RIGHT  Result Date: 05/07/2022 CLINICAL DATA:  Right hip arthroplasty. EXAM: DG HIP (WITH OR WITHOUT PELVIS) 2-3V RIGHT COMPARISON:  Radiograph dated 05/07/2022. FINDINGS: Total right hip arthroplasty. The arthroplasty components appear intact and in anatomic alignment. There is no acute fracture or dislocation. Degenerative changes of the lower lumbar spine. Postoperative changes of the soft tissues of the right hip. IMPRESSION: Total right hip arthroplasty. No complicating features. Electronically Signed   By: Anner Crete M.D.   On: 05/07/2022 17:32   DG HIP UNILAT WITH PELVIS 1V RIGHT  Result Date: 05/07/2022 CLINICAL DATA:  Cross-table pelvis for intraoperative radiograph of right hip. EXAM: DG HIP (WITH OR WITHOUT PELVIS) 1V RIGHT COMPARISON:  Pelvis and right hip radiographs 01/10/2022 FINDINGS: Single frontal intraoperative view of the right hip. The patient is undergoing total right hip arthroplasty. No hardware complication is seen. IMPRESSION: Intraoperative radiograph of total right hip arthroplasty without evidence of hardware complication. Electronically Signed   By: Yvonne Kendall M.D.   On: 05/07/2022 15:38    Disposition:  Discharge disposition: 01-Home or Self Care       Discharge Instructions     Call MD / Call 911   Complete by: As directed    If you experience chest pain or shortness of breath, CALL 911 and be transported to the hospital emergency room.  If you develope a fever above 101 F, pus (white drainage) or increased drainage or redness at the wound, or calf pain, call your surgeon's office.   Constipation Prevention   Complete by: As directed    Drink plenty of fluids.  Prune juice may be helpful.  You may use a stool softener, such as Colace (over the  counter) 100 mg twice a day.  Use MiraLax (over the counter) for constipation as needed.   Diet - low sodium heart healthy   Complete by: As directed    Increase activity slowly as tolerated   Complete by: As directed    Post-operative opioid taper instructions:   Complete by: As directed    POST-OPERATIVE OPIOID TAPER INSTRUCTIONS: It is important to wean off of your opioid medication as soon as possible. If you do not need pain medication after your surgery it is ok to stop day one. Opioids include: Codeine, Hydrocodone(Norco, Vicodin), Oxycodone(Percocet, oxycontin) and hydromorphone amongst others.  Long term and even short term use of opiods can cause: Increased pain response Dependence Constipation Depression Respiratory depression And more.  Withdrawal symptoms can include Flu like symptoms Nausea, vomiting And more Techniques to manage these symptoms Hydrate well Eat regular healthy meals Stay active Use relaxation techniques(deep breathing, meditating, yoga) Do Not substitute Alcohol to help with tapering If you have been on opioids for less than two weeks and do not have pain than it is ok to stop all together.  Plan to wean off of opioids This plan should start within one week post op of your joint replacement. Maintain the same interval or time between taking each dose and first decrease the dose.  Cut the total daily  intake of opioids by one tablet each day Next start to increase the time between doses. The last dose that should be eliminated is the evening dose.

## 2022-05-08 NOTE — TOC Transition Note (Signed)
Transition of Care [facility]) - CM/SW Discharge Note  Patient Details  Name: Angela West MRN: [id number] Date of Birth: 1955/09/28  Transition of Care [facility]) CM/SW Contact:  Sherie Don, LCSW Phone Number: 05/08/2022, 9:59 AM  Clinical Narrative: Patient is expected to discharge home after working with PT. CSW met with patient to confirm discharge plan and needs. Patient will go home with OPPT at [facility]. Patient reported she will need a youth rolling walker, but is unsure if she received one through insurance in the past 5 years. Patient aware it will be private pay if insurance already covered a walker. CSW updated Kaitlyn with [employer]. [employer] to deliver youth rolling walker to patient's room. TOC signing off.    Final next level of care: OP Rehab Barriers to Discharge: No Barriers Identified  Patient Goals and CMS Choice CMS Medicare.gov Compare Post Acute Care list provided to:: Patient Choice offered to / list presented to : Patient  Discharge Plan and Services Additional resources added to the After Visit Summary for        DME Arranged: Walker youth DME Agency: [employer] Representative spoke with at DME Agency: Prearranged in orthopedist's office  Social Determinants of Health ([place]) Interventions SDOH Screenings   Food Insecurity: No Food Insecurity (05/07/2022)  Housing: Low Risk  (05/07/2022)  Transportation Needs: No Transportation Needs (05/07/2022)  Utilities: Not At Risk (05/07/2022)  Tobacco Use: High Risk (05/07/2022)   Readmission Risk Interventions     No data to display

## 2022-05-08 NOTE — Progress Notes (Signed)
     Subjective:  Patient reports pain as mild.  Tough time sleeping at night. Eager to work with PT today. Hopeful to dc home today so she can sleep in her own bed tonight.  Objective:   VITALS:   Vitals:   05/07/22 1803 05/07/22 2214 05/08/22 0108 05/08/22 0528  BP: 139/75 119/67 126/83 120/63  Pulse: 74 81 76 82  Resp: '18 17 18 18  '$ Temp: 98.5 F (36.9 C) 98.1 F (36.7 C) (!) 97.5 F (36.4 C) 97.9 F (36.6 C)  TempSrc: Oral Oral Oral Oral  SpO2: 100% 99% 94% 97%  Weight:      Height:        Sensation intact distally Intact pulses distally Dorsiflexion/Plantar flexion intact Incision: dressing C/D/I Compartment soft   Lab Results  Component Value Date   WBC 13.2 (H) 05/08/2022   HGB 11.0 (L) 05/08/2022   HCT 31.0 (L) 05/08/2022   MCV 94.5 05/08/2022   PLT 215 05/08/2022   BMET    Component Value Date/Time   NA 134 (L) 05/08/2022 0340   K 4.1 05/08/2022 0340   CL 102 05/08/2022 0340   CO2 24 05/08/2022 0340   GLUCOSE 129 (H) 05/08/2022 0340   BUN 21 05/08/2022 0340   CREATININE 0.86 05/08/2022 0340   CREATININE 0.75 01/06/2013 1414   CALCIUM 8.6 (L) 05/08/2022 0340   GFRNONAA >60 05/08/2022 0340    Xray: THA components in good position no adverse features.  Assessment/Plan: 1 Day Post-Op   Principal Problem:   Osteoarthritis of right hip, unspecified osteoarthritis type  S/p R THA 05/07/22  Post op recs: WB: WBAT RLE, No formal hip precautions Abx: ancef Imaging: PACU pelvis Xray Dressing: Aquacell, keep intact until follow up DVT prophylaxis: Aspirin 81BID starting POD1 Follow up: 2 weeks after surgery for a wound check with Dr. Zachery Dakins at [facility].  Address: [address] [place], [place], [place] [address]  Office Phone: [phone]  Willaim Sheng 05/08/2022, 6:22 AM   Charlies Constable, MD  Contact information:   [phone] 7am-5pm epic message Dr. Zachery Dakins, or call office for patient follow up: [phone]  [phone] After hours and holidays please check [email or web address] for group call information for [facility]

## 2022-05-08 NOTE — Evaluation (Signed)
Physical Therapy Evaluation Patient Details Name: Angela West MRN: [id number] DOB: December 09, 1955 Today's Date: 05/08/2022  History of Present Illness  Pt s/p R THR  Clinical Impression  Pt s/p R THR and presents with decreased R LE strength/ROM and post op pain limiting functional mobility.  Pt should progress to dc home with family assist and reports first OP Pt scheduled for 05/12/22.     Recommendations for follow up therapy are one component of a multi-disciplinary discharge planning process, led by the attending physician.  Recommendations may be updated based on patient status, additional functional criteria and insurance authorization.  Follow Up Recommendations Follow physician's recommendations for discharge plan and follow up therapies      Assistance Recommended at Discharge Intermittent Supervision/Assistance  Patient can return home with the following  A little help with walking and/or transfers;A little help with bathing/dressing/bathroom;Assistance with cooking/housework;Assist for transportation;Help with stairs or ramp for entrance    Equipment Recommendations Rolling walker (2 wheels) (youth RW please)  Recommendations for Other Services       Functional Status Assessment Patient has had a recent decline in their functional status and demonstrates the ability to make significant improvements in function in a reasonable and predictable amount of time.     Precautions / Restrictions Precautions Precautions: Fall Precaution Comments: No THP per physician order Restrictions Weight Bearing Restrictions: No Other Position/Activity Restrictions: WBAT      Mobility  Bed Mobility Overal bed mobility: Needs Assistance Bed Mobility: Supine to Sit, Sit to Supine     Supine to sit: Supervision Sit to supine: Min guard, Supervision   General bed mobility comments: increased time with min VC and no physical assist    Transfers Overall transfer level: Needs  assistance Equipment used: Rolling walker (2 wheels) Transfers: Sit to/from Stand Sit to Stand: Min guard, Supervision           General transfer comment: cues for LE management and use of UEs to self assist    Ambulation/Gait Ambulation/Gait assistance: Min guard Gait Distance (Feet): 150 Feet Assistive device: Rolling walker (2 wheels) Gait Pattern/deviations: Step-to pattern, Step-through pattern, Decreased step length - right, Decreased step length - left, Shuffle, Trunk flexed Gait velocity: decr     General Gait Details: cues for posture, position from RW and initial sequence  Stairs            Wheelchair Mobility    Modified Rankin (Stroke Patients Only)       Balance Overall balance assessment: Mild deficits observed, not formally tested                                           Pertinent Vitals/Pain Pain Assessment Pain Assessment: 0-10 Pain Score: 3  Pain Location: R hip Pain Descriptors / Indicators: Aching, Sore Pain Intervention(s): Limited activity within patient's tolerance, Monitored during session, Premedicated before session    Home Living Family/patient expects to be discharged to:: Private residence Living Arrangements: Spouse/significant other Available Help at Discharge: Family Type of Home: House Home Access: Stairs to enter Entrance Stairs-Rails: Psychiatric nurse of Steps: 3+1 Alternate Level Stairs-Number of Steps: FLIGHT Home Layout: Two level Home Equipment: Cane - single point      Prior Function Prior Level of Function : Independent/Modified Independent             Mobility Comments: using cane as needed  Hand Dominance        Extremity/Trunk Assessment   Upper Extremity Assessment Upper Extremity Assessment: Overall WFL for tasks assessed    Lower Extremity Assessment Lower Extremity Assessment: RLE deficits/detail RLE Deficits / Details: AAROM at hip to 80 flex  and 15 abd    Cervical / Trunk Assessment Cervical / Trunk Assessment: Normal  Communication   Communication: No difficulties  Cognition Arousal/Alertness: Awake/alert Behavior During Therapy: WFL for tasks assessed/performed Overall Cognitive Status: Within Functional Limits for tasks assessed                                          General Comments      Exercises Total Joint Exercises Ankle Circles/Pumps: AROM, Both, 15 reps, Supine Quad Sets: AROM, Both, 10 reps, Supine Heel Slides: AAROM, Right, 20 reps, Supine Hip ABduction/ADduction: AAROM, Left, 15 reps, Supine Long Arc Quad: AAROM, Left, 10 reps, Seated   Assessment/Plan    PT Assessment Patient needs continued PT services  PT Problem List Decreased strength;Decreased range of motion;Decreased activity tolerance;Decreased balance;Decreased mobility;Decreased knowledge of use of DME;Pain       PT Treatment Interventions DME instruction;Gait training;Stair training;Functional mobility training;Therapeutic exercise;Therapeutic activities;Patient/family education    PT Goals (Current goals can be found in the Care Plan section)  Acute Rehab PT Goals Patient Stated Goal: Regain IND PT Goal Formulation: With patient Time For Goal Achievement: 05/15/22 Potential to Achieve Goals: Good    Frequency 7X/week     Co-evaluation               AM-PAC PT "6 Clicks" Mobility  Outcome Measure Help needed turning from your back to your side while in a flat bed without using bedrails?: A Little Help needed moving from lying on your back to sitting on the side of a flat bed without using bedrails?: A Little Help needed moving to and from a bed to a chair (including a wheelchair)?: A Little Help needed standing up from a chair using your arms (e.g., wheelchair or bedside chair)?: A Little Help needed to walk in hospital room?: A Little Help needed climbing 3-5 steps with a railing? : A Lot 6 Click  Score: 17    End of Session Equipment Utilized During Treatment: Gait belt Activity Tolerance: Patient tolerated treatment well Patient left: in chair;with call bell/phone within reach;with chair alarm set Nurse Communication: Mobility status PT Visit Diagnosis: Difficulty in walking, not elsewhere classified (R26.2)    Time: [id number] PT Time Calculation (min) (ACUTE ONLY): 45 min   Charges:   PT Evaluation $PT Eval Low Complexity: 1 Low PT Treatments $Gait Training: 8-22 mins $Therapeutic Exercise: 8-22 mins        Debe Coder PT Acute Rehabilitation Services Pager [phone] Office [phone]   [facility] 05/08/2022, 12:39 PM

## 2022-05-27 ENCOUNTER — Other Ambulatory Visit (HOSPITAL_BASED_OUTPATIENT_CLINIC_OR_DEPARTMENT_OTHER): Payer: Self-pay

## 2022-05-28 ENCOUNTER — Other Ambulatory Visit (HOSPITAL_BASED_OUTPATIENT_CLINIC_OR_DEPARTMENT_OTHER): Payer: Self-pay

## 2022-05-28 MED ORDER — ZEPBOUND 5 MG/0.5ML ~~LOC~~ SOAJ
SUBCUTANEOUS | 0 refills | Status: DC
  Filled 2022-05-28: qty 2, 28d supply, fill #0

## 2022-06-04 ENCOUNTER — Other Ambulatory Visit (HOSPITAL_BASED_OUTPATIENT_CLINIC_OR_DEPARTMENT_OTHER): Payer: Self-pay

## 2022-06-04 MED ORDER — DULOXETINE HCL 30 MG PO CPEP
30.0000 mg | ORAL_CAPSULE | Freq: Every day | ORAL | 1 refills | Status: DC
  Filled 2022-06-04: qty 30, 30d supply, fill #0

## 2022-06-04 MED ORDER — HYDROCODONE-ACETAMINOPHEN 7.5-325 MG PO TABS
1.0000 | ORAL_TABLET | Freq: Four times a day (QID) | ORAL | 0 refills | Status: DC | PRN
  Filled 2022-06-04: qty 120, 30d supply, fill #0

## 2022-07-07 ENCOUNTER — Other Ambulatory Visit (HOSPITAL_BASED_OUTPATIENT_CLINIC_OR_DEPARTMENT_OTHER): Payer: Self-pay

## 2022-07-07 MED ORDER — DULOXETINE HCL 30 MG PO CPEP
30.0000 mg | ORAL_CAPSULE | Freq: Every day | ORAL | 1 refills | Status: DC
  Filled 2022-07-07: qty 30, 30d supply, fill #0

## 2022-07-07 MED ORDER — HYDROCODONE-ACETAMINOPHEN 5-325 MG PO TABS
1.0000 | ORAL_TABLET | Freq: Four times a day (QID) | ORAL | 0 refills | Status: DC | PRN
  Filled 2022-07-07: qty 120, 30d supply, fill #0

## 2022-07-07 MED ORDER — CYCLOBENZAPRINE HCL 5 MG PO TABS
5.0000 mg | ORAL_TABLET | Freq: Three times a day (TID) | ORAL | 1 refills | Status: DC | PRN
  Filled 2022-07-07: qty 90, 30d supply, fill #0

## 2022-07-10 ENCOUNTER — Other Ambulatory Visit: Payer: BC Managed Care – PPO

## 2022-07-10 ENCOUNTER — Other Ambulatory Visit: Payer: BC Managed Care – PPO | Admitting: Obstetrics and Gynecology

## 2022-08-18 ENCOUNTER — Other Ambulatory Visit (HOSPITAL_BASED_OUTPATIENT_CLINIC_OR_DEPARTMENT_OTHER): Payer: Self-pay

## 2022-08-18 MED ORDER — MELOXICAM 15 MG PO TABS
15.0000 mg | ORAL_TABLET | Freq: Every day | ORAL | 1 refills | Status: AC
  Filled 2022-08-18: qty 90, 90d supply, fill #0

## 2022-08-18 MED ORDER — HYDROCODONE-ACETAMINOPHEN 5-325 MG PO TABS
1.0000 | ORAL_TABLET | Freq: Four times a day (QID) | ORAL | 0 refills | Status: DC | PRN
  Filled 2022-08-18: qty 120, 30d supply, fill #0

## 2022-08-18 MED ORDER — CYCLOBENZAPRINE HCL 5 MG PO TABS
5.0000 mg | ORAL_TABLET | Freq: Three times a day (TID) | ORAL | 1 refills | Status: AC | PRN
  Filled 2022-08-18: qty 90, 30d supply, fill #0

## 2022-08-18 NOTE — Progress Notes (Unsigned)
67 y.o. G1P0010 Married White or Caucasian Not Hispanic or Latino female here for annual exam.  No vaginal bleeding. Not sexually active secondary to ED.    H/O incidental finding of a left ovarian cyst last year. Due for f/u u/s. Last u/s in 11/23 Left ovary 3.65 x 3.30 x 4.31 cm 4.3 x 3.6 x 3.3 cm simple appearing cyst with a single septation, avascular No significant change in size or appearance  She had a right hip replacement in 3/24. She is doing great!  Patient's last menstrual period was 12/27/2010 (exact date).          Sexually active: No.  The current method of family planning is post menopausal status.    Exercising: No.   Smoker:  yes, 3/4 PPD.   Health Maintenance: Pap:  04/01/18 normal Hr Hpv Neg, 02/05/15 WNL HR HPV Neg.  History of abnormal Pap:  yes, 26 years ago f/u was normal  MMG:  05/05/22 Bi-rads 1 neg  BMD:   never Colonoscopy: Colonoscopy: 2017 Normal per patient 10 year f/u  TDaP:  01/30/14  Gardasil: NA     reports that she has been smoking cigarettes. She has been smoking an average of .5 packs per day. She has never used smokeless tobacco. She reports that she does not currently use alcohol after a past usage of about 3.0 - 4.0 standard drinks of alcohol per week. She reports that she does not use drugs. 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 Husband is also retired.   Past Medical History:  Diagnosis Date   Abnormal Pap smear 10/1993   ASCUS   Anemia    Arthritis    Asthma    seasonal   Complication of anesthesia    Novacaine   Fibroids 12/2010   numerous   Gastritis    HPV in female    Hypercholesterolemia    Hypertension    IBS (irritable bowel syndrome)    Insomnia    Peptic ulcer    Pre-diabetes    hx of   Smoker     Past Surgical History:  Procedure Laterality Date   CERVICAL DISCECTOMY  01/28/2000   ENDOMETRIAL BIOPSY  12/2010   negative path   EYE MUSCLE SURGERY Bilateral    cataracts   FOOT  SURGERY Right    KNEE ARTHROSCOPY W/ MENISCAL REPAIR Left    ROTATOR CUFF REPAIR Right 2009 or 2010   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   Procedure: TOTAL HIP ARTHROPLASTY;  Surgeon: Joen Laura, MD;  Location: [facility];  Service: Orthopedics;  Laterality: Right;   TRIGGER FINGER RELEASE  2012   right thumb    Current Outpatient Medications  Medication Sig Dispense Refill   Cholecalciferol (VITAMIN D3) 50 MCG (2000 UT) CAPS Take 2,000 Units by mouth daily.     cyanocobalamin (VITAMIN B12) 1000 MCG tablet Take 1,000 mcg by mouth daily.     cyclobenzaprine (FLEXERIL) 10 MG tablet Take 10 mg by mouth 3 (three) times daily as needed for muscle spasms.     cyclobenzaprine (FLEXERIL) 5 MG tablet Take 1 tablet (5 mg total) by mouth 3 (three) times daily as needed for pain. 90 tablet 1   cyclobenzaprine (FLEXERIL) 5 MG tablet Take 1 tablet (5 mg total) by mouth 3 (three) times daily as needed. 90 tablet 1   gabapentin (NEURONTIN) 300 MG capsule Take 300-600 mg by mouth 2 (two) times daily as needed (restless leg).  HYDROcodone-acetaminophen (NORCO/VICODIN) 5-325 MG tablet Take 1 tablet by mouth every 6 (six) hours as needed for pain dose reduction 120 tablet 0   losartan (COZAAR) 50 MG tablet Take 50 mg by mouth daily.     meloxicam (MOBIC) 15 MG tablet Take 1 tablet (15 mg total) by mouth daily. 30 tablet 0   meloxicam (MOBIC) 15 MG tablet Take 1 tablet (15 mg total) by mouth daily. 90 tablet 1   metoprolol succinate (TOPROL-XL) 50 MG 24 hr tablet Take 50 mg by mouth daily.     promethazine (PHENERGAN) 25 MG tablet Take 25 mg by mouth every 6 (six) hours as needed for nausea or vomiting.     rosuvastatin (CRESTOR) 20 MG tablet Take 20 mg by mouth daily.     tirzepatide (ZEPBOUND) 7.5 MG/0.5ML Pen Inject 7.5 mg into the skin once a week.     No current facility-administered medications for this visit.    Family History  Problem Relation Age of Onset   Thyroid disease Mother     Hypertension Mother    Hyperlipidemia Mother    Breast cancer Mother 79   Down syndrome Brother     Review of Systems  Reason unable to perform ROS: Increase in hot flashes at night.    Exam:   BP 130/74 (BP Location: Right Arm, Patient Position: Sitting)   Pulse 84   Resp 20   Ht 5' 0.24" (1.53 m)   Wt 170 lb (77.1 kg)   LMP 12/27/2010 (Exact Date)   SpO2 94%   BMI 32.94 kg/m   Weight change: @WEIGHTCHANGE @ Height:   Height: 5' 0.24" (153 cm)  Ht Readings from Last 3 Encounters:  08/20/22 5' 0.24" (1.53 m)  05/07/22 5\' 1"  (1.549 m)  04/24/22 5\' 1"  (1.549 m)    General appearance: alert, cooperative and appears stated age Head: Normocephalic, without obvious abnormality, atraumatic Neck: no adenopathy, supple, symmetrical, trachea midline and thyroid normal to inspection and palpation Breasts: normal appearance, no masses or tenderness Abdomen: soft, non-tender; non distended,  no masses,  no organomegaly Extremities: extremities normal, atraumatic, no cyanosis or edema Skin: Skin color, texture, turgor normal. No rashes or lesions Lymph nodes: Cervical, supraclavicular, and axillary nodes normal. No abnormal inguinal nodes palpated Neurologic: Grossly normal   Pelvic: External genitalia:  no lesions              Urethra:  normal appearing urethra with no masses, tenderness or lesions              Bartholins and Skenes: normal                 Vagina: normal appearing vagina with normal color and discharge, no lesions              Cervix: no lesions               Bimanual Exam:  Uterus:   no masses or tenderness              Adnexa: no mass, fullness, tenderness               Rectovaginal: Confirms               Anus:  normal sphincter tone, no lesions  Carmelina Dane, RN chaperoned for the exam.  1. Encounter for breast and pelvic examination Discussed breast self exam Discussed calcium and vit D intake Mammogram and colonoscopy are UTD Labs with primary  2.  Screening  for cervical cancer - Cytology - PAP  3. Left ovarian cyst - US PELVIS TRANSVAGINAL NON-OB (TV ONLY); Future  4. Hypoestrogenism - DG Bone Density; Future

## 2022-08-20 ENCOUNTER — Other Ambulatory Visit (HOSPITAL_BASED_OUTPATIENT_CLINIC_OR_DEPARTMENT_OTHER): Payer: Self-pay

## 2022-08-20 ENCOUNTER — Encounter: Payer: Self-pay | Admitting: Obstetrics and Gynecology

## 2022-08-20 ENCOUNTER — Ambulatory Visit (INDEPENDENT_AMBULATORY_CARE_PROVIDER_SITE_OTHER): Payer: Medicare Other | Admitting: Obstetrics and Gynecology

## 2022-08-20 ENCOUNTER — Other Ambulatory Visit (HOSPITAL_COMMUNITY)
Admission: RE | Admit: 2022-08-20 | Discharge: 2022-08-20 | Disposition: A | Discharge status: Home / Self Care | Payer: Medicare Other | Source: Ambulatory Visit | Attending: Obstetrics and Gynecology | Admitting: Obstetrics and Gynecology

## 2022-08-20 VITALS — BP 130/74 | HR 84 | Resp 20 | Ht 60.24 in | Wt 170.0 lb

## 2022-08-20 DIAGNOSIS — E2839 Other primary ovarian failure: Secondary | ICD-10-CM | POA: Diagnosis not present

## 2022-08-20 DIAGNOSIS — Z124 Encounter for screening for malignant neoplasm of cervix: Secondary | ICD-10-CM | POA: Insufficient documentation

## 2022-08-20 DIAGNOSIS — N83202 Unspecified ovarian cyst, left side: Secondary | ICD-10-CM | POA: Diagnosis not present

## 2022-08-20 DIAGNOSIS — Z01419 Encounter for gynecological examination (general) (routine) without abnormal findings: Secondary | ICD-10-CM | POA: Insufficient documentation

## 2022-08-20 DIAGNOSIS — Z1151 Encounter for screening for human papillomavirus (HPV): Secondary | ICD-10-CM | POA: Insufficient documentation

## 2022-08-20 NOTE — Patient Instructions (Signed)

## 2022-08-21 LAB — CYTOLOGY - PAP
Comment: NEGATIVE
Diagnosis: NEGATIVE
High risk HPV: NEGATIVE

## 2022-10-21 ENCOUNTER — Other Ambulatory Visit: Payer: BC Managed Care – PPO

## 2022-11-07 ENCOUNTER — Telehealth: Payer: Self-pay | Admitting: *Deleted

## 2022-11-07 DIAGNOSIS — N83202 Unspecified ovarian cyst, left side: Secondary | ICD-10-CM

## 2022-11-07 NOTE — Telephone Encounter (Signed)
Call placed to patient. Left detailed message, ok per dpr. Advised PUS appt cancelled. New order placed to be scheduled at outside imaging facility. Contact [facility] at [phone] to schedule. Once imaging results received, Dr. Edward Jolly will review and our office will f/u with recommendations. Return call to office if any additional questions, [phone], opt 5.   Routing to provider FYI.   Encounter closed.

## 2023-09-28 ENCOUNTER — Other Ambulatory Visit (HOSPITAL_COMMUNITY)
Admission: RE | Admit: 2023-09-28 | Discharge: 2023-09-28 | Disposition: A | Discharge status: Home / Self Care | Source: Ambulatory Visit | Attending: Obstetrics and Gynecology | Admitting: Obstetrics and Gynecology

## 2023-09-28 ENCOUNTER — Encounter: Payer: Self-pay | Admitting: Obstetrics and Gynecology

## 2023-09-28 ENCOUNTER — Ambulatory Visit: Payer: Self-pay | Admitting: Obstetrics and Gynecology

## 2023-09-28 ENCOUNTER — Ambulatory Visit (INDEPENDENT_AMBULATORY_CARE_PROVIDER_SITE_OTHER): Admitting: Obstetrics and Gynecology

## 2023-09-28 VITALS — BP 122/84 | HR 91 | Ht 60.5 in | Wt 168.0 lb

## 2023-09-28 DIAGNOSIS — N898 Other specified noninflammatory disorders of vagina: Secondary | ICD-10-CM

## 2023-09-28 DIAGNOSIS — Z01419 Encounter for gynecological examination (general) (routine) without abnormal findings: Secondary | ICD-10-CM

## 2023-09-28 DIAGNOSIS — D219 Benign neoplasm of connective and other soft tissue, unspecified: Secondary | ICD-10-CM | POA: Diagnosis not present

## 2023-09-28 DIAGNOSIS — Z8742 Personal history of other diseases of the female genital tract: Secondary | ICD-10-CM | POA: Insufficient documentation

## 2023-09-28 DIAGNOSIS — N3281 Overactive bladder: Secondary | ICD-10-CM

## 2023-09-28 DIAGNOSIS — Z1231 Encounter for screening mammogram for malignant neoplasm of breast: Secondary | ICD-10-CM

## 2023-09-28 DIAGNOSIS — Z9189 Other specified personal risk factors, not elsewhere classified: Secondary | ICD-10-CM

## 2023-09-28 DIAGNOSIS — M810 Age-related osteoporosis without current pathological fracture: Secondary | ICD-10-CM

## 2023-09-28 DIAGNOSIS — N952 Postmenopausal atrophic vaginitis: Secondary | ICD-10-CM

## 2023-09-28 DIAGNOSIS — E2839 Other primary ovarian failure: Secondary | ICD-10-CM | POA: Diagnosis not present

## 2023-09-28 DIAGNOSIS — Z1211 Encounter for screening for malignant neoplasm of colon: Secondary | ICD-10-CM

## 2023-09-28 DIAGNOSIS — N941 Unspecified dyspareunia: Secondary | ICD-10-CM

## 2023-09-28 LAB — URINALYSIS
Glucose, UA: NEGATIVE
Nitrite: NEGATIVE
Specific Gravity, Urine: 1.02 (ref 1.001–1.035)
pH: 5.5 (ref 5.0–8.0)

## 2023-09-28 MED ORDER — INTRAROSA 6.5 MG VA INST: 1.0000 | VAGINAL_INSERT | Freq: Every evening | VAGINAL | 12 refills | Status: DC | PRN

## 2023-09-28 NOTE — Progress Notes (Addendum)
 68 y.o. y.o. female here for annual medicare gyn exam. Patient's last menstrual period was 12/27/2010 (exact date).   G1P0010 Married White or Caucasian Not Hispanic or Latino female here for annual exam.  No vaginal bleeding. Not sexually active secondary to ED.    H/O incidental finding of a left ovarian cyst last year. Due for f/u u/s. Last u/s in 11/23 Left ovary 3.65 x 3.30 x 4.31 cm 4.3 x 3.6 x 3.3 cm simple appearing cyst with a single septation, avascular No significant change in size or appearance  Reports voiding q 2 hours and has had evaluation with urology for this  She had a right hip replacement in 3/24. She is doing great!  Patient's last menstrual period was 12/27/2010 (exact date).          Sexually active: No.  The current method of family planning is post menopausal status.    Exercising: No.   Smoker:  yes, 3/4 PPD.   Health Maintenance: Pap:  04/01/18 normal Hr Hpv Neg, 02/05/15 WNL HR HPV Neg.  History of abnormal Pap:  yes, 26 years ago f/u was normal  MMG:  05/05/22 Bi-rads 1 neg  BMD:   04/09/23 osteoporosis LFN -2.6 tried fosamax and could not tolerate with reflux. Desires evenity/prolia. PA sent. Colonoscopy: Colonoscopy: 2017 Normal per patient 10 year f/u  TDaP:  01/30/14  Gardasil: NA Body mass index is 32.27 kg/m.      No data to display          Blood pressure 122/84, pulse 91, height 5' 0.5 (1.537 m), weight 168 lb (76.2 kg), last menstrual period 12/27/2010, SpO2 93%.     Component Value Date/Time   DIAGPAP  08/20/2022 1513    - Negative for intraepithelial lesion or malignancy (NILM)   DIAGPAP  04/01/2018 0000    NEGATIVE FOR INTRAEPITHELIAL LESIONS OR MALIGNANCY.   HPVHIGH Negative 08/20/2022 1513   ADEQPAP  08/20/2022 1513    Satisfactory for evaluation; transformation zone component PRESENT.   ADEQPAP  04/01/2018 0000    Satisfactory for evaluation  endocervical/transformation zone component PRESENT.    GYN HISTORY:     Component Value Date/Time   DIAGPAP  08/20/2022 1513    - Negative for intraepithelial lesion or malignancy (NILM)   DIAGPAP  04/01/2018 0000    NEGATIVE FOR INTRAEPITHELIAL LESIONS OR MALIGNANCY.   HPVHIGH Negative 08/20/2022 1513   ADEQPAP  08/20/2022 1513    Satisfactory for evaluation; transformation zone component PRESENT.   ADEQPAP  04/01/2018 0000    Satisfactory for evaluation  endocervical/transformation zone component PRESENT.    OB History  Gravida Para Term Preterm AB Living  1 0 0 0 1 0  SAB IAB Ectopic Multiple Live Births  0 0 0 0 0    # Outcome Date GA Lbr Len/2nd Weight Sex Type Anes PTL Lv  1 AB             Past Medical History:  Diagnosis Date   Abnormal Pap smear 10/1993   ASCUS   Anemia    Arthritis    Asthma    seasonal   Complication of anesthesia    Novacaine   Fibroids 12/2010   numerous   Gastritis    HPV in female    Hypercholesterolemia    Hypertension    IBS (irritable bowel syndrome)    Insomnia    Peptic ulcer    Pre-diabetes    hx of   Smoker  Past Surgical History:  Procedure Laterality Date   CERVICAL DISCECTOMY  01/28/2000   ENDOMETRIAL BIOPSY  12/2010   negative path   EYE MUSCLE SURGERY Bilateral    cataracts   FOOT SURGERY Right    KNEE ARTHROSCOPY W/ MENISCAL REPAIR Left    ROTATOR CUFF REPAIR Right 2009 or 2010   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   Procedure: TOTAL HIP ARTHROPLASTY;  Surgeon: Edna Toribio LABOR, MD;  Location: [facility];  Service: Orthopedics;  Laterality: Right;   TRIGGER FINGER RELEASE  2012   right thumb    Current Outpatient Medications on File Prior to Visit  Medication Sig Dispense Refill   alendronate (FOSAMAX) 70 MG tablet Take 70 mg by mouth.     amLODipine (NORVASC) 10 MG tablet Take 10 mg by mouth daily.     Cholecalciferol  (VITAMIN D3) 50 MCG (2000 UT) CAPS Take 2,000 Units by mouth daily.     cyanocobalamin  (VITAMIN B12) 1000 MCG tablet Take 1,000 mcg by mouth daily.      cyclobenzaprine  (FLEXERIL ) 10 MG tablet Take 10 mg by mouth 3 (three) times daily as needed for muscle spasms.     cyclobenzaprine  (FLEXERIL ) 5 MG tablet Take 1 tablet (5 mg total) by mouth 3 (three) times daily as needed for pain. 90 tablet 1   cyclobenzaprine  (FLEXERIL ) 5 MG tablet Take 1 tablet (5 mg total) by mouth 3 (three) times daily as needed. 90 tablet 1   gabapentin  (NEURONTIN ) 300 MG capsule Take 300-600 mg by mouth 2 (two) times daily as needed (restless leg).     HYDROcodone -acetaminophen  (NORCO/VICODIN) 5-325 MG tablet Take 1 tablet by mouth every 6 (six) hours as needed for pain dose reduction 120 tablet 0   losartan  (COZAAR ) 50 MG tablet Take 50 mg by mouth daily.     meloxicam  (MOBIC ) 15 MG tablet Take 1 tablet (15 mg total) by mouth daily. 30 tablet 0   meloxicam  (MOBIC ) 15 MG tablet Take 1 tablet (15 mg total) by mouth daily. 90 tablet 1   metoprolol  succinate (TOPROL -XL) 50 MG 24 hr tablet Take 50 mg by mouth daily.     promethazine  (PHENERGAN ) 25 MG tablet Take 25 mg by mouth every 6 (six) hours as needed for nausea or vomiting.     rosuvastatin  (CRESTOR ) 20 MG tablet Take 20 mg by mouth daily.     tirzepatide  (ZEPBOUND ) 7.5 MG/0.5ML Pen Inject 7.5 mg into the skin once a week.     No current facility-administered medications on file prior to visit.    Social History   Socioeconomic History   Marital status: Married    Spouse name: Not on file   Number of children: 0   Years of education: Not on file   Highest education level: Not on file  Occupational History   Not on file  Tobacco Use   Smoking status: Every Day    Current packs/day: 0.50    Types: Cigarettes   Smokeless tobacco: Never  Vaping Use   Vaping status: Never Used  Substance and Sexual Activity   Alcohol use: Yes    Alcohol/week: 3.0 - 4.0 standard drinks of alcohol    Types: 3 - 4 Standard drinks or equivalent per week    Comment: occ   Drug use: No   Sexual activity: Not Currently     Partners: Male    Birth control/protection: Post-menopausal    Comment: Less than 5, after 18, no STD, no DES, no abnormal pap,  no hx of breast cancer  Other Topics Concern   Not on file  Social History Narrative   Not on file   Social Drivers of Health   Financial Resource Strain: Not on file  Food Insecurity: Low Risk  (02/06/2023)   Received from [facility]   Hunger Vital Sign    Within the past 12 months, you worried that your food would run out before you got money to buy more: Never true    Within the past 12 months, the food you bought just didn't last and you didn't have money to get more. : Never true  Transportation Needs: No Transportation Needs (02/06/2023)   Received from [facility]    In the past 12 months, has lack of reliable transportation kept you from medical appointments, meetings, work or from getting things needed for daily living? : No  Physical Activity: Not on file  Stress: Not on file  Social Connections: Not on file  Intimate Partner Violence: Not At Risk (05/07/2022)   Humiliation, Afraid, Rape, and Kick questionnaire    Fear of Current or Ex-Partner: No    Emotionally Abused: No    Physically Abused: No    Sexually Abused: No    Family History  Problem Relation Age of Onset   Thyroid disease Mother    Hypertension Mother    Hyperlipidemia Mother    Breast cancer Mother 39   Down syndrome Brother      Allergies  Allergen Reactions   Erythromycin Nausea And Vomiting   Penicillins Hives      Patient's last menstrual period was Patient's last menstrual period was 12/27/2010 (exact date)..            Review of Systems Alls systems reviewed and are negative.     Physical Exam Constitutional:      Appearance: Normal appearance.  Genitourinary:     Vulva and urethral meatus normal.     No lesions in the vagina.     Right Labia: No rash, lesions or skin changes.    Left Labia: No lesions, skin changes or rash.     No vaginal discharge or tenderness.     No vaginal prolapse present.    Mild vaginal atrophy present.     Right Adnexa: not tender, not palpable and no mass present.    Left Adnexa: not tender, not palpable and no mass present.    No cervical motion tenderness or discharge.     Uterus is not enlarged, tender or irregular.  Breasts:    Right: Normal.     Left: Normal.  HENT:     Head: Normocephalic.  Neck:     Thyroid: No thyroid mass, thyromegaly or thyroid tenderness.  Cardiovascular:     Rate and Rhythm: Normal rate and regular rhythm.     Heart sounds: Normal heart sounds, S1 normal and S2 normal.  Pulmonary:     Effort: Pulmonary effort is normal.     Breath sounds: Normal breath sounds and air entry.  Abdominal:     General: There is no distension.     Palpations: Abdomen is soft. There is no mass.     Tenderness: There is no abdominal tenderness. There is no guarding or rebound.  Musculoskeletal:        General: Normal range of motion.     Cervical back: Full passive range of motion without pain, normal range of motion and neck supple. No tenderness.  Right lower leg: No edema.     Left lower leg: No edema.  Neurological:     Mental Status: She is alert.  Skin:    General: Skin is warm.  Psychiatric:        Mood and Affect: Mood normal.        Behavior: Behavior normal.        Thought Content: Thought content normal.  Vitals and nursing note reviewed. Exam conducted with a chaperone present.       A:         Well Woman medicare GYN exam, osteoporosis with reflux on fosamax, smoker, fibroids             Odorous vaginal discharge noted by patient. OAB.                P:        Pap smear collected today Encouraged annual mammogram screening Colon cancer screening up-to-date DXA up-to-date Desires evenity/prolia to send prolia.  Counseled on the r/b/a/I of the medication Labs and immunizations to do with PMD Discussed breast self exams Encouraged healthy  lifestyle practices Encouraged Vit D and Calcium   Nu swab sent to rule out infection. To begin intrarosa  for dysapreunia, vaginal dryness and OAB. Nothing else has been tried. Referral to PT as well for OAB UA/UC sent PUS to evaluate fibroids.  Referral placed to [facility] center.  No follow-ups on file.  Angela West

## 2023-09-29 ENCOUNTER — Telehealth: Payer: Self-pay

## 2023-09-29 LAB — SURESWAB® ADVANCED VAGINITIS PLUS,TMA
C. trachomatis RNA, TMA: NOT DETECTED
CANDIDA SPECIES: NOT DETECTED
Candida glabrata: NOT DETECTED
N. gonorrhoeae RNA, TMA: NOT DETECTED
SURESWAB(R) ADV BACTERIAL VAGINOSIS(BV),TMA: NEGATIVE
TRICHOMONAS VAGINALIS (TV),TMA: NOT DETECTED

## 2023-09-29 NOTE — Telephone Encounter (Signed)
 A prior authorization was submitted today via [email or web address]  Sent to [employer] KEY: [id number]  Approval of Prescription Drug Coverage: In response to you or your physician's or other prescriber's recent request, we have  approved coverage of INTRAROSA  6.5 MG VAG INSERT.   This authorization is good until 03/02/2024.  You can get this medication by presenting a prescription from your doctor or other  prescriber at any participating pharmacy (if you do not already have a prescription on file).  If you have questions, please call our Customer Care team at [phone]. ([phone]).  Our hours are 8:00 a.m. to 8:00 p.m. local time, Monday through Friday.

## 2023-09-30 LAB — URINE CULTURE
MICRO NUMBER:: 16753144
Result:: NO GROWTH
SPECIMEN QUALITY:: ADEQUATE

## 2023-10-04 LAB — CYTOLOGY - PAP: Diagnosis: NEGATIVE

## 2023-10-05 ENCOUNTER — Other Ambulatory Visit: Payer: Self-pay | Admitting: *Deleted

## 2023-10-05 DIAGNOSIS — M810 Age-related osteoporosis without current pathological fracture: Secondary | ICD-10-CM

## 2023-10-05 MED ORDER — ROMOSOZUMAB-AQQG 105 MG/1.17ML ~~LOC~~ SOSY: 210.0000 mg | PREFILLED_SYRINGE | Freq: Once | SUBCUTANEOUS | Status: DC

## 2023-10-15 ENCOUNTER — Telehealth: Payer: Self-pay | Admitting: *Deleted

## 2023-10-15 NOTE — Telephone Encounter (Signed)
 Call to patient. Patient advised of 20% coinsurance for Evenity  injection which would be $490/monthly. Patient states she can not afford to do that at this time, but could possibly in 6-7 months. States she has an appointment for PUS on 10/22/23 with Dr. Glennon and can discuss in detail further at that appointment. States she has been off of Fosamax since last appointment, does take vitamin d , does not take calcium  and does not exercise regularly. RN advised would update Dr. Glennon.   Routing to provider as RICK.

## 2023-10-22 ENCOUNTER — Other Ambulatory Visit: Payer: Self-pay | Admitting: *Deleted

## 2023-10-22 ENCOUNTER — Ambulatory Visit (INDEPENDENT_AMBULATORY_CARE_PROVIDER_SITE_OTHER)

## 2023-10-22 ENCOUNTER — Ambulatory Visit (INDEPENDENT_AMBULATORY_CARE_PROVIDER_SITE_OTHER): Admitting: Obstetrics and Gynecology

## 2023-10-22 ENCOUNTER — Encounter: Payer: Self-pay | Admitting: Obstetrics and Gynecology

## 2023-10-22 VITALS — BP 112/82 | HR 84 | Wt 166.2 lb

## 2023-10-22 DIAGNOSIS — Z9189 Other specified personal risk factors, not elsewhere classified: Secondary | ICD-10-CM | POA: Diagnosis not present

## 2023-10-22 DIAGNOSIS — D219 Benign neoplasm of connective and other soft tissue, unspecified: Secondary | ICD-10-CM | POA: Diagnosis not present

## 2023-10-22 DIAGNOSIS — M810 Age-related osteoporosis without current pathological fracture: Secondary | ICD-10-CM

## 2023-10-22 DIAGNOSIS — N83202 Unspecified ovarian cyst, left side: Secondary | ICD-10-CM

## 2023-10-22 MED ORDER — DENOSUMAB 60 MG/ML ~~LOC~~ SOSY
60.0000 mg | PREFILLED_SYRINGE | Freq: Once | SUBCUTANEOUS | Status: AC
Start: 2023-11-25 — End: 2023-12-17
  Administered 2023-12-17: 60 mg via SUBCUTANEOUS

## 2023-10-22 NOTE — Patient Instructions (Signed)
 Hi Cy Perkins said she will reach out to you today about the schedule for prolia . Thank you Dr. Glennon

## 2023-10-22 NOTE — Progress Notes (Signed)
 PUS results  6.37cm uterus Multiple fibroids measuring 4.78, 1.78, 1.33, 1.40, 2.05, 1.39, 1.49, 0.86, 2.95, 1.57cm  Thin EML 2.70mm Left ovary with 5.6cm x 4.5cm  simple avascular cyst (increase in size from previous scan) Prior US  4.3 x 3.6 x 3.3cm  No adnexal masses No free fluid  Ca125 (8) in 2023  Patient reports she did feel pain over the left ovary during the ultrasound.    Enlarging ovarian cyst in menopause, pelvic pain Repeat OVA1 today.  Discussed that only pathology can confirm 100% that it is benign and this is with removal.  Discussed RLH at the same time due to associated risk with uterine cancer with ovarian and due to multiple fibroids and risk for an additional surgery in the future. Patient agreed and reviewed procedure with patient in detail and post op expectations. Surgery referral placed 2.  Patient states she wants to continue with prolia  and understands the financial cost and will discuss with Damien and scheduling. 30 minutes spent on reviewing records, imaging,  and one on one patient time and counseling patient and documentation Dr. Glennon  Dr. Glennon

## 2023-10-27 LAB — OVARIAN MALIGNANCY RISK-ROMA
CA125: 17 U/mL (ref <35)
HE4, OVARIAN CANCER MONITORING: 89 pmol/L
ROMA Postmenopausal: 2.02 (ref <2.77)
ROMA Premenopausal: 2.41 — ABNORMAL HIGH (ref <1.31)

## 2023-10-28 ENCOUNTER — Ambulatory Visit: Payer: Self-pay | Admitting: Obstetrics and Gynecology

## 2023-10-28 ENCOUNTER — Telehealth: Payer: Self-pay

## 2023-10-28 NOTE — Telephone Encounter (Signed)
 Patient called & wanted to find out about her ROMA labwork that was done on 10-22-23. 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 Patient is aware after the provider reviews it that she will be notified.

## 2023-11-20 ENCOUNTER — Encounter: Payer: Self-pay | Admitting: Obstetrics and Gynecology

## 2023-11-20 ENCOUNTER — Other Ambulatory Visit (HOSPITAL_COMMUNITY)
Admission: RE | Admit: 2023-11-20 | Discharge: 2023-11-20 | Disposition: A | Discharge status: Home / Self Care | Source: Ambulatory Visit | Attending: Obstetrics and Gynecology | Admitting: Obstetrics and Gynecology

## 2023-11-20 ENCOUNTER — Ambulatory Visit: Admitting: Obstetrics and Gynecology

## 2023-11-20 VITALS — BP 110/78 | HR 85 | Wt 165.0 lb

## 2023-11-20 DIAGNOSIS — D219 Benign neoplasm of connective and other soft tissue, unspecified: Secondary | ICD-10-CM

## 2023-11-20 DIAGNOSIS — Z01818 Encounter for other preprocedural examination: Secondary | ICD-10-CM | POA: Insufficient documentation

## 2023-11-20 DIAGNOSIS — N83202 Unspecified ovarian cyst, left side: Secondary | ICD-10-CM | POA: Diagnosis present

## 2023-11-20 NOTE — Addendum Note (Signed)
 Addended by: GLENNON ALMARIE POUR on: 11/20/2023 03:56 PM   Modules accepted: Orders

## 2023-11-20 NOTE — Progress Notes (Signed)
 PUS results  6.37cm uterus Multiple fibroids measuring 4.78, 1.78, 1.33, 1.40, 2.05, 1.39, 1.49, 0.86, 2.95, 1.57cm  Thin EML 2.19mm Left ovary with 5.6cm x 4.5cm  simple avascular cyst (increase in size from previous scan) Prior US  4.3 x 3.6 x 3.3cm  No adnexal masses No free fluid  Ca125 (8) in 2023  Patient reports she did feel pain over the left ovary during the ultrasound.   TIME OUT PERFORMED PROCEDURE: EMB Consent obtained for the procedure.  A bivalve speculum was placed in the vagina.  The cervix was grasped with a single tooth tenaculum.  Pipelle was inserted and rotated. Could only reach 4cm and patient felt like she was going to have a vagal response.  Adequate specimen was obtained and sent to pathology.  All instruments were removed.  Patient tolerated the procedure well.  To notify patient of the results.  Enlarging ovarian cyst in menopause, pelvic pain Repeat OVA1 today.  Discussed that only pathology can confirm 100% that it is benign and this is with removal.  Discussed RLH at the same time due to associated risk with uterine cancer with ovarian and due to multiple fibroids and risk for an additional surgery in the future. Patient agreed and reviewed procedure with patient in detail and post op expectations. Surgery referral placed 2.  Patient states she wants to continue with prolia  and understands the financial cost and will discuss with Damien and scheduling. 3. EMB collected today.  To notify patient of the result. If scant cells discussed will still proceed with RLH/BSO and risk for additional procedure if cancer is found is about 1% with endometrial cancer.  She voiced understanding.   Dr. Glennon

## 2023-11-24 LAB — SURGICAL PATHOLOGY

## 2023-11-25 ENCOUNTER — Ambulatory Visit: Payer: Self-pay | Admitting: Obstetrics and Gynecology

## 2023-11-25 ENCOUNTER — Ambulatory Visit: Admitting: Obstetrics and Gynecology

## 2023-11-25 NOTE — Telephone Encounter (Signed)
 See Surgery referral.

## 2023-11-25 NOTE — Telephone Encounter (Signed)
 Dr. Glennon -please review. Ok to proceed with scheduling surgery.

## 2023-11-27 ENCOUNTER — Encounter: Payer: Self-pay | Admitting: Obstetrics and Gynecology

## 2023-12-01 ENCOUNTER — Other Ambulatory Visit: Payer: Self-pay | Admitting: Obstetrics and Gynecology

## 2023-12-01 MED ORDER — DENOSUMAB 60 MG/ML ~~LOC~~ SOSY: 60.0000 mg | PREFILLED_SYRINGE | Freq: Once | SUBCUTANEOUS | 0 refills | Status: AC

## 2023-12-01 NOTE — Progress Notes (Signed)
 Dxa 2025 -2.6 Stopped fosamax. She will pay 390$q6 month for prolia . Can try to send through [facility] and see if cost applies since on hold with prolia  and [employer] and new biosimilar medications coming out. Dr. Glennon

## 2023-12-03 ENCOUNTER — Telehealth: Payer: Self-pay

## 2023-12-03 NOTE — Telephone Encounter (Signed)
 Patient called & stated her pharmacy called & told her they have her prolia . They told her she can call and pay them over the phone. Patient is asking if she should do that and have them send the medication to us  or if she should get it & then come in for an appointment to us . Routing information to [facility] for review. Patient would like a callback. Patient is aware Damien is out of the office until next week.

## 2023-12-07 ENCOUNTER — Encounter: Payer: Self-pay | Admitting: Obstetrics and Gynecology

## 2023-12-07 DIAGNOSIS — N83202 Unspecified ovarian cyst, left side: Secondary | ICD-10-CM

## 2023-12-07 DIAGNOSIS — K5900 Constipation, unspecified: Secondary | ICD-10-CM

## 2023-12-07 DIAGNOSIS — R109 Unspecified abdominal pain: Secondary | ICD-10-CM

## 2023-12-07 NOTE — Telephone Encounter (Signed)
 Call to patient. Patient advised to contact [facility] and authorize shipment to our office. Address provided. RN advised once medication received in our office, would contact patient to schedule nurse visit. Patient agreeable.

## 2023-12-08 ENCOUNTER — Ambulatory Visit (INDEPENDENT_AMBULATORY_CARE_PROVIDER_SITE_OTHER)

## 2023-12-08 ENCOUNTER — Other Ambulatory Visit: Payer: Self-pay | Admitting: Obstetrics and Gynecology

## 2023-12-08 ENCOUNTER — Telehealth: Payer: Self-pay

## 2023-12-08 ENCOUNTER — Ambulatory Visit: Payer: Self-pay | Admitting: Obstetrics and Gynecology

## 2023-12-08 ENCOUNTER — Encounter: Payer: Self-pay | Admitting: Obstetrics and Gynecology

## 2023-12-08 DIAGNOSIS — N83202 Unspecified ovarian cyst, left side: Secondary | ICD-10-CM | POA: Diagnosis not present

## 2023-12-08 DIAGNOSIS — Z0189 Encounter for other specified special examinations: Secondary | ICD-10-CM

## 2023-12-08 MED ORDER — DENOSUMAB 60 MG/ML ~~LOC~~ SOSY
60.0000 mg | PREFILLED_SYRINGE | Freq: Once | SUBCUTANEOUS | 0 refills | Status: AC
Start: 2023-12-08 — End: 2023-12-08

## 2023-12-08 MED ORDER — DENOSUMAB-BBDZ 60 MG/ML ~~LOC~~ SOSY: 60.0000 mg | PREFILLED_SYRINGE | Freq: Once | SUBCUTANEOUS | Status: AC

## 2023-12-08 NOTE — Telephone Encounter (Signed)
 Routing to Dr. Glennon to review. I have offered 01/08/24 for surgery.

## 2023-12-08 NOTE — Telephone Encounter (Signed)
 See 2nd MyChart encounter dated 12/08/23.   Routing FYI.   Will close this encounter.

## 2023-12-08 NOTE — Telephone Encounter (Signed)
-----   Message from [name] sent at 12/08/2023  1:25 PM EDT ----- Any chance she could stop by for a vit D check before prolia  shot? Make sure to take  Vit D 3000 international units a day 1200mg  calcium  a day  Prolia  can cause lower vit D and calcium . Calcium  and GFR are normal on sept labs. Dr. Carpen

## 2023-12-08 NOTE — Telephone Encounter (Signed)
 Left message for patient to call triage and schedule labs. Will place orders Vitamin D  labs.

## 2023-12-08 NOTE — Telephone Encounter (Signed)
 Spoke with patient. Advised per Dr. Glennon.   Surgery scheduled for 01/08/24.  Pre-op scheduled for 12/17/23 at 1130, will plan for Prolia  injection during this visit. Prolia  to be shipped from [facility].  PUS only scheduled for today at [facility] at 1430, order placed.  Referral placed to [facility]. Has been seen here previously for colonoscopy.   Patient reports XRAY showed no obstruction, has not been called by PCP yet, saw her results on MyChart. States PCP though she may have had a stomach flu that was lingering. Denies diarrhea, had 1 loose stool following constipation on 12/07/23. Denies fever, occasional chills, is uncertain if due to weather. Reports nausea and cramping. No blood in stool. No recent abx.   Advised I will update Dr. Glennon. Our office will notify once prolia  injection received. Recommended patient reach out to GI since she is an established patient to schedule, referral also placed. Questions answered. Advised our office will f/u with any additional recommendations. Patient agreeable.   Routing to Dr. Glennon LIPS -see XRAY results in Care Everywhere.   Cc: Damien Hedge

## 2023-12-08 NOTE — Telephone Encounter (Signed)
 Patient called and states that its a long drive for her to come to our office. She says that she take a vitamin D . Supplement daily. We have Vitamin D3 2000 international unit on file. Patient confirmed that dose. She had her Vitamin D  check in March and it was normal. She would like to know if she can not come in for the testing. Or have the labs done the day she is here to get her injection.

## 2023-12-08 NOTE — Telephone Encounter (Signed)
 Left message to call [facility] Triage at [phone], option 4.

## 2023-12-08 NOTE — Telephone Encounter (Signed)
See surgery referral

## 2023-12-09 ENCOUNTER — Telehealth: Payer: Self-pay | Admitting: *Deleted

## 2023-12-09 NOTE — Telephone Encounter (Signed)
 Patient left message stating GI contacted her, next available appt 02/2024. Will this evaluation need to be completed prior to surgery?

## 2023-12-10 ENCOUNTER — Other Ambulatory Visit

## 2023-12-10 NOTE — Telephone Encounter (Signed)
 Call placed to [facility], spoke with Olam, currently scheduling into 02/2024.   Call placed to [facility] in [facility], spoke with Elenor, patient also conference into call. Patient scheduled at [facility] in [place] on 12/24/23 at 0900.    Patient reports she is feeling better today. Appreciative of call.   Routing to provider for final review. Patient is agreeable to disposition. Will close encounter.  Cc: Renda

## 2023-12-17 ENCOUNTER — Encounter: Payer: Self-pay | Admitting: Obstetrics and Gynecology

## 2023-12-17 ENCOUNTER — Ambulatory Visit: Admitting: Obstetrics and Gynecology

## 2023-12-17 VITALS — BP 118/70 | HR 73 | Wt 162.6 lb

## 2023-12-17 DIAGNOSIS — N83202 Unspecified ovarian cyst, left side: Secondary | ICD-10-CM | POA: Diagnosis not present

## 2023-12-17 DIAGNOSIS — Z01818 Encounter for other preprocedural examination: Secondary | ICD-10-CM | POA: Diagnosis not present

## 2023-12-17 DIAGNOSIS — M810 Age-related osteoporosis without current pathological fracture: Secondary | ICD-10-CM | POA: Diagnosis not present

## 2023-12-17 DIAGNOSIS — M8080XK Other osteoporosis with current pathological fracture, unspecified site, subsequent encounter for fracture with nonunion: Secondary | ICD-10-CM

## 2023-12-17 DIAGNOSIS — Z1211 Encounter for screening for malignant neoplasm of colon: Secondary | ICD-10-CM

## 2023-12-17 LAB — VITAMIN D 25 HYDROXY (VIT D DEFICIENCY, FRACTURES): Vit D, 25-Hydroxy: 54 ng/mL (ref 30–100)

## 2023-12-17 MED ORDER — IBUPROFEN 800 MG PO TABS: 800.0000 mg | ORAL_TABLET | Freq: Three times a day (TID) | ORAL | 1 refills | Status: DC | PRN

## 2023-12-17 MED ORDER — METOCLOPRAMIDE HCL 10 MG PO TABS: 10.0000 mg | ORAL_TABLET | Freq: Three times a day (TID) | ORAL | 0 refills | Status: DC | PRN

## 2023-12-17 MED ORDER — OXYCODONE HCL 5 MG PO TABS: 5.0000 mg | ORAL_TABLET | ORAL | 0 refills | Status: DC | PRN

## 2023-12-17 NOTE — Progress Notes (Signed)
 Patient presents today for PREOP for RLH, BSO, cystoscopy  Patient with enlarging ovarian cyst in menopause and pain over the left side.  PUS results  6.37cm uterus Multiple fibroids measuring 4.78, 1.78, 1.33, 1.40, 2.05, 1.39, 1.49, 0.86, 2.95, 1.57cm  Thin EML 2.85mm Left ovary with 5.6cm x 4.5cm  simple avascular cyst (increase in size from previous scan) Prior US  4.3 x 3.6 x 3.3cm  No adnexal masses No free fluid  Ca125 (8) in 2023  Patient reports she did feel pain over the left ovary during the ultrasound.    PATHOLOGY SURGICAL PATHOLOGY CASE: [id number] PATIENT: Angela West Surgical Pathology Report     Clinical History: cyst of left ovary, fibroids (cf)     FINAL MICROSCOPIC DIAGNOSIS:  A. ENDOMETRIUM, BIOPSY: Abundant mucus with scant benign, atrophic squamous and endocervical mucosa. No endometrial tissue identified.  GROSS DESCRIPTION:  Specimen is received in formalin and consists of a 2.1 x 1.8 x 0.4 cm aggregate of tan soft tissue and mucus.  The specimen is entirely submitted in 1 cassette.  (KL 11/23/2023)  Did not tolerate EMB well and low risk for cancer. Explained risks for additional surgery if endometrial cancer was found   Past Medical History:  Diagnosis Date   Abnormal Pap smear 10/1993   ASCUS   Anemia    Arthritis    Asthma    seasonal   Complication of anesthesia    Novacaine   Fibroids 12/2010   numerous   Gastritis    HPV in female    Hypercholesterolemia    Hypertension    IBS (irritable bowel syndrome)    Insomnia    Peptic ulcer    Pre-diabetes    hx of   Smoker    Past Surgical History:  Procedure Laterality Date   CERVICAL DISCECTOMY  01/28/2000   ENDOMETRIAL BIOPSY  12/2010   negative path   EYE MUSCLE SURGERY Bilateral    cataracts   FOOT SURGERY Right    KNEE ARTHROSCOPY W/ MENISCAL REPAIR Left    ROTATOR CUFF REPAIR Right 2009 or 2010   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   Procedure: TOTAL  HIP ARTHROPLASTY;  Surgeon: Edna Toribio LABOR, MD;  Location: [facility];  Service: Orthopedics;  Laterality: Right;   TRIGGER FINGER RELEASE  2012   right thumb   Social History   Socioeconomic History   Marital status: Married    Spouse name: Not on file   Number of children: 0   Years of education: Not on file   Highest education level: Not on file  Occupational History   Not on file  Tobacco Use   Smoking status: Every Day    Current packs/day: 0.50    Types: Cigarettes   Smokeless tobacco: Never  Vaping Use   Vaping status: Never Used  Substance and Sexual Activity   Alcohol use: Yes    Alcohol/week: 3.0 - 4.0 standard drinks of alcohol    Types: 3 - 4 Standard drinks or equivalent per week    Comment: occ   Drug use: No   Sexual activity: Not Currently    Partners: Male    Birth control/protection: Post-menopausal    Comment: Less than 5, after 18, no STD, no DES, no abnormal pap, no hx of breast cancer  Other Topics Concern   Not on file  Social History Narrative   Not on file   Social Drivers of Health   Financial Resource Strain: Not on file  Food  Insecurity: Low Risk  (02/06/2023)   Received from [facility]   Hunger Vital Sign    Within the past 12 months, you worried that your food would run out before you got money to buy more: Never true    Within the past 12 months, the food you bought just didn't last and you didn't have money to get more. : Never true  Transportation Needs: No Transportation Needs (02/06/2023)   Received from [facility]    In the past 12 months, has lack of reliable transportation kept you from medical appointments, meetings, work or from getting things needed for daily living? : No  Physical Activity: Not on file  Stress: Not on file  Social Connections: Not on file   OB History     Gravida  1   Para  0   Term  0   Preterm  0   AB  1   Living  0      SAB  0   IAB  0   Ectopic  0   Multiple   0   Live Births  0          Past Surgical History:  Procedure Laterality Date   CERVICAL DISCECTOMY  01/28/2000   ENDOMETRIAL BIOPSY  12/2010   negative path   EYE MUSCLE SURGERY Bilateral    cataracts   FOOT SURGERY Right    KNEE ARTHROSCOPY W/ MENISCAL REPAIR Left    ROTATOR CUFF REPAIR Right 2009 or 2010   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   Procedure: TOTAL HIP ARTHROPLASTY;  Surgeon: Edna Toribio LABOR, MD;  Location: [facility];  Service: Orthopedics;  Laterality: Right;   TRIGGER FINGER RELEASE  2012   right thumb   Current Outpatient Medications on File Prior to Visit  Medication Sig Dispense Refill   amLODipine (NORVASC) 10 MG tablet Take 10 mg by mouth daily.     calcium  carbonate (OS-CAL) 1250 (500 Ca) MG chewable tablet Chew 1 tablet by mouth.     Cholecalciferol  (VITAMIN D3) 50 MCG (2000 UT) CAPS Take 2,000 Units by mouth daily.     cyanocobalamin  (VITAMIN B12) 1000 MCG tablet Take 1,000 mcg by mouth daily.     cyclobenzaprine  (FLEXERIL ) 5 MG tablet Take 1 tablet (5 mg total) by mouth 3 (three) times daily as needed for pain. 90 tablet 1   cyclobenzaprine  (FLEXERIL ) 5 MG tablet Take 1 tablet (5 mg total) by mouth 3 (three) times daily as needed. 90 tablet 1   gabapentin  (NEURONTIN ) 300 MG capsule Take 300-600 mg by mouth 2 (two) times daily as needed (restless leg).     HYDROcodone -acetaminophen  (NORCO/VICODIN) 5-325 MG tablet Take 1 tablet by mouth every 6 (six) hours as needed for pain dose reduction 120 tablet 0   losartan  (COZAAR ) 50 MG tablet Take 50 mg by mouth daily.     meloxicam  (MOBIC ) 15 MG tablet Take 1 tablet (15 mg total) by mouth daily. 30 tablet 0   meloxicam  (MOBIC ) 15 MG tablet Take 1 tablet (15 mg total) by mouth daily. 90 tablet 1   metoprolol  succinate (TOPROL -XL) 50 MG 24 hr tablet Take 50 mg by mouth daily.     omeprazole (PRILOSEC) 40 MG capsule Take 40 mg by mouth.     Prasterone  (INTRAROSA ) 6.5 MG INST Place 1 suppository vaginally at  bedtime as needed. 30 each 12   promethazine  (PHENERGAN ) 25 MG tablet Take 25 mg by mouth every 6 (six)  hours as needed for nausea or vomiting.     rosuvastatin  (CRESTOR ) 20 MG tablet Take 20 mg by mouth daily.     alendronate (FOSAMAX) 70 MG tablet Take 70 mg by mouth. (Patient not taking: Reported on 12/17/2023)     cyclobenzaprine  (FLEXERIL ) 10 MG tablet Take 10 mg by mouth 3 (three) times daily as needed for muscle spasms.     tirzepatide  (ZEPBOUND ) 7.5 MG/0.5ML Pen Inject 7.5 mg into the skin once a week. (Patient not taking: Reported on 12/17/2023)     Current Facility-Administered Medications on File Prior to Visit  Medication Dose Route Frequency Provider Last Rate Last Admin   denosumab  (PROLIA ) injection 60 mg  60 mg Subcutaneous Once Stoy Fenn K, MD       denosumab -bernett SOSY 60 mg  60 mg Subcutaneous Once        Romosozumab -aqqg (EVENITY ) 105 MG/1. injection 210 mg  210 mg Subcutaneous Once Kasee Hantz K, MD       Allergies  Allergen Reactions   Erythromycin Nausea And Vomiting   Penicillins Hives   S1S2 CTA B/L Soft, NT  Enlarging ovarian cyst in menopause, pelvic pain ASCUS pap smear, osteoporosis, preop for RLH, BSO, cystosocpy   The risks of surgery were discussed in detail with the patient including but not limited to: bleeding which may require transfusion or reoperation; infection which may require prolonged hospitalization or re-hospitalization and antibiotic therapy; injury to bowel, bladder, ureters and major vessels or other surrounding organs which may lead to other procedures; formation of adhesions; need for additional procedures including laparotomy or subsequent procedures secondary to intraoperative injury or abnormal pathology; thromboembolic phenomenon; incisional problems and other postoperative or anesthesia complications.  The postoperative expectations were also discussed in detail. The patient also understands the alternative treatment  options which were discussed in full. All questions were answered.  Patient would like to proceed with the procedure.  Prolia  was given today and she did well. Dr. Glennon  40 minutes spent on reviewing records, imaging,  and one on one patient time and counseling patient and documentation Dr. Glennon   Dr. Glennon

## 2023-12-17 NOTE — H&P (View-Only) (Signed)
 Patient presents today for PREOP for RLH, BSO, cystoscopy  Patient with enlarging ovarian cyst in menopause and pain over the left side.  PUS results  6.37cm uterus Multiple fibroids measuring 4.78, 1.78, 1.33, 1.40, 2.05, 1.39, 1.49, 0.86, 2.95, 1.57cm  Thin EML 2.85mm Left ovary with 5.6cm x 4.5cm  simple avascular cyst (increase in size from previous scan) Prior US  4.3 x 3.6 x 3.3cm  No adnexal masses No free fluid  Ca125 (8) in 2023  Patient reports she did feel pain over the left ovary during the ultrasound.    PATHOLOGY SURGICAL PATHOLOGY CASE: [id number] PATIENT: Angela West Surgical Pathology Report     Clinical History: cyst of left ovary, fibroids (cf)     FINAL MICROSCOPIC DIAGNOSIS:  A. ENDOMETRIUM, BIOPSY: Abundant mucus with scant benign, atrophic squamous and endocervical mucosa. No endometrial tissue identified.  GROSS DESCRIPTION:  Specimen is received in formalin and consists of a 2.1 x 1.8 x 0.4 cm aggregate of tan soft tissue and mucus.  The specimen is entirely submitted in 1 cassette.  (KL 11/23/2023)  Did not tolerate EMB well and low risk for cancer. Explained risks for additional surgery if endometrial cancer was found   Past Medical History:  Diagnosis Date   Abnormal Pap smear 10/1993   ASCUS   Anemia    Arthritis    Asthma    seasonal   Complication of anesthesia    Novacaine   Fibroids 12/2010   numerous   Gastritis    HPV in female    Hypercholesterolemia    Hypertension    IBS (irritable bowel syndrome)    Insomnia    Peptic ulcer    Pre-diabetes    hx of   Smoker    Past Surgical History:  Procedure Laterality Date   CERVICAL DISCECTOMY  01/28/2000   ENDOMETRIAL BIOPSY  12/2010   negative path   EYE MUSCLE SURGERY Bilateral    cataracts   FOOT SURGERY Right    KNEE ARTHROSCOPY W/ MENISCAL REPAIR Left    ROTATOR CUFF REPAIR Right 2009 or 2010   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   Procedure: TOTAL  HIP ARTHROPLASTY;  Surgeon: Edna Toribio LABOR, MD;  Location: [facility];  Service: Orthopedics;  Laterality: Right;   TRIGGER FINGER RELEASE  2012   right thumb   Social History   Socioeconomic History   Marital status: Married    Spouse name: Not on file   Number of children: 0   Years of education: Not on file   Highest education level: Not on file  Occupational History   Not on file  Tobacco Use   Smoking status: Every Day    Current packs/day: 0.50    Types: Cigarettes   Smokeless tobacco: Never  Vaping Use   Vaping status: Never Used  Substance and Sexual Activity   Alcohol use: Yes    Alcohol/week: 3.0 - 4.0 standard drinks of alcohol    Types: 3 - 4 Standard drinks or equivalent per week    Comment: occ   Drug use: No   Sexual activity: Not Currently    Partners: Male    Birth control/protection: Post-menopausal    Comment: Less than 5, after 18, no STD, no DES, no abnormal pap, no hx of breast cancer  Other Topics Concern   Not on file  Social History Narrative   Not on file   Social Drivers of Health   Financial Resource Strain: Not on file  Food  Insecurity: Low Risk  (02/06/2023)   Received from [facility]   Hunger Vital Sign    Within the past 12 months, you worried that your food would run out before you got money to buy more: Never true    Within the past 12 months, the food you bought just didn't last and you didn't have money to get more. : Never true  Transportation Needs: No Transportation Needs (02/06/2023)   Received from [employer]    In the past 12 months, has lack of reliable transportation kept you from medical appointments, meetings, work or from getting things needed for daily living? : No  Physical Activity: Not on file  Stress: Not on file  Social Connections: Not on file   OB History     Gravida  1   Para  0   Term  0   Preterm  0   AB  1   Living  0      SAB  0   IAB  0   Ectopic  0   Multiple   0   Live Births  0          Past Surgical History:  Procedure Laterality Date   CERVICAL DISCECTOMY  01/28/2000   ENDOMETRIAL BIOPSY  12/2010   negative path   EYE MUSCLE SURGERY Bilateral    cataracts   FOOT SURGERY Right    KNEE ARTHROSCOPY W/ MENISCAL REPAIR Left    ROTATOR CUFF REPAIR Right 2009 or 2010   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   Procedure: TOTAL HIP ARTHROPLASTY;  Surgeon: Edna Toribio LABOR, MD;  Location: [facility];  Service: Orthopedics;  Laterality: Right;   TRIGGER FINGER RELEASE  2012   right thumb   Current Outpatient Medications on File Prior to Visit  Medication Sig Dispense Refill   amLODipine (NORVASC) 10 MG tablet Take 10 mg by mouth daily.     calcium  carbonate (OS-CAL) 1250 (500 Ca) MG chewable tablet Chew 1 tablet by mouth.     Cholecalciferol  (VITAMIN D3) 50 MCG (2000 UT) CAPS Take 2,000 Units by mouth daily.     cyanocobalamin  (VITAMIN B12) 1000 MCG tablet Take 1,000 mcg by mouth daily.     cyclobenzaprine  (FLEXERIL ) 5 MG tablet Take 1 tablet (5 mg total) by mouth 3 (three) times daily as needed for pain. 90 tablet 1   cyclobenzaprine  (FLEXERIL ) 5 MG tablet Take 1 tablet (5 mg total) by mouth 3 (three) times daily as needed. 90 tablet 1   gabapentin  (NEURONTIN ) 300 MG capsule Take 300-600 mg by mouth 2 (two) times daily as needed (restless leg).     HYDROcodone -acetaminophen  (NORCO/VICODIN) 5-325 MG tablet Take 1 tablet by mouth every 6 (six) hours as needed for pain dose reduction 120 tablet 0   losartan  (COZAAR ) 50 MG tablet Take 50 mg by mouth daily.     meloxicam  (MOBIC ) 15 MG tablet Take 1 tablet (15 mg total) by mouth daily. 30 tablet 0   meloxicam  (MOBIC ) 15 MG tablet Take 1 tablet (15 mg total) by mouth daily. 90 tablet 1   metoprolol  succinate (TOPROL -XL) 50 MG 24 hr tablet Take 50 mg by mouth daily.     omeprazole (PRILOSEC) 40 MG capsule Take 40 mg by mouth.     Prasterone  (INTRAROSA ) 6.5 MG INST Place 1 suppository vaginally at  bedtime as needed. 30 each 12   promethazine  (PHENERGAN ) 25 MG tablet Take 25 mg by mouth every 6 (six)  hours as needed for nausea or vomiting.     rosuvastatin  (CRESTOR ) 20 MG tablet Take 20 mg by mouth daily.     alendronate (FOSAMAX) 70 MG tablet Take 70 mg by mouth. (Patient not taking: Reported on 12/17/2023)     cyclobenzaprine  (FLEXERIL ) 10 MG tablet Take 10 mg by mouth 3 (three) times daily as needed for muscle spasms.     tirzepatide  (ZEPBOUND ) 7.5 MG/0.5ML Pen Inject 7.5 mg into the skin once a week. (Patient not taking: Reported on 12/17/2023)     Current Facility-Administered Medications on File Prior to Visit  Medication Dose Route Frequency Provider Last Rate Last Admin   denosumab  (PROLIA ) injection 60 mg  60 mg Subcutaneous Once Stoy Fenn K, MD       denosumab -bernett SOSY 60 mg  60 mg Subcutaneous Once        Romosozumab -aqqg (EVENITY ) 105 MG/1. injection 210 mg  210 mg Subcutaneous Once Kasee Hantz K, MD       Allergies  Allergen Reactions   Erythromycin Nausea And Vomiting   Penicillins Hives   S1S2 CTA B/L Soft, NT  Enlarging ovarian cyst in menopause, pelvic pain ASCUS pap smear, osteoporosis, preop for RLH, BSO, cystosocpy   The risks of surgery were discussed in detail with the patient including but not limited to: bleeding which may require transfusion or reoperation; infection which may require prolonged hospitalization or re-hospitalization and antibiotic therapy; injury to bowel, bladder, ureters and major vessels or other surrounding organs which may lead to other procedures; formation of adhesions; need for additional procedures including laparotomy or subsequent procedures secondary to intraoperative injury or abnormal pathology; thromboembolic phenomenon; incisional problems and other postoperative or anesthesia complications.  The postoperative expectations were also discussed in detail. The patient also understands the alternative treatment  options which were discussed in full. All questions were answered.  Patient would like to proceed with the procedure.  Prolia  was given today and she did well. Dr. Glennon  40 minutes spent on reviewing records, imaging,  and one on one patient time and counseling patient and documentation Dr. Glennon   Dr. Glennon

## 2023-12-17 NOTE — Patient Instructions (Signed)
 Robotic Laparoscopic Hysterectomy, Care After  The following information offers guidance on how to care for yourself after your procedure. Your health care provider may also give you more specific instructions. If you have problems or questions, contact your health care provider. What can I expect after the procedure? After the procedure, it is common to have: Pain, bruising, and numbness around your incisions. Tiredness (fatigue).  Abdominal bloating Poor appetite. Chest discomfort that radiates to your shoulder from the carbon dioxide gas for a few days after  Vaginal discharge or spotting. You will need to use a sanitary pad after this procedure.  HEAVY BLEEDING LIKE A PERIOD IS NOT NORMAL.  PLEASE CALL YOUR PROVIDER IF SOAKING A PAD or have copious discharge and or pain.  Feelings of sadness or other emotions.  If your ovaries were also removed, it is also common to have symptoms of menopause, such as hot flashes, night sweats, and lack of sleep (insomnia).  Ovaries should stay in if at all possible until at least the age of 62. Follow these instructions at home: Medicines Take over-the-counter and prescription medicines only as told by your health care provider. Ask your health care provider if the medicine prescribed to you: Requires you to avoid driving or using machinery. You cannot drive for 24 hours after anesthesia Can cause constipation. You may need to take these actions to prevent or treat constipation: Drink enough fluid to keep your urine pale yellow. Take over-the-counter or prescription medicines. Eat foods that are high in fiber, such as beans, whole grains, and fresh fruits and vegetables. Limit foods that are high in fat and processed sugars, such as fried or sweet foods.  Also, avoid spicy foods.  NAUSEA IS COMMON THE FIRST NIGHT OF SURGERY.  IF IT LASTS BEYOND 24 HOURS, CALL YOUR PROVIDER.  NAUSEA MEDICATION WAS GIVEN AT YOUR PREOP APPOINTMENT THAT YOU CAN TAKE  AFTER SURGERY. Incision care  Follow instructions from your health care provider about how to take care of your incisions. Make sure you: LEAVE INCISION OPEN AND DRY-NO BANDAGES Leave stitches (sutures), skin glue, or adhesive strips in place UNTIL 2 WEEKS THEN REMOVE IN THE SHOWER.  If adhesive strip edges start to loosen and curl up, you may trim the loose edges. Check your incision areas every day for signs of infection. Check for: More redness, swelling, or pain. Fluid or blood. Warmth. Pus or a bad smell. Activity  Rest as told by your health care provider. Avoid sitting for a long time without moving. Get up to take short walks every 1-2 hours. This is important to improve blood flow and breathing. Ask for help if you feel weak or unsteady.  If you are sore or tired, rest. Return to your normal activities as told by your health care provider. Ask your health care provider what activities are safe for you. Do not lift, push or pull anything that is heavier than 13 lb (4.5 kg), or the limit that you are told, for 6 WEEKS after surgery or until your health care provider says that it is safe. If you were given a sedative during the procedure, it can affect you for several hours. Do not drive or operate machinery until your health care provider says that it is safe. Lifestyle Do not use any products that contain nicotine or tobacco. These products include cigarettes, chewing tobacco, and vaping devices, such as e-cigarettes. These can delay healing after surgery. If you need help quitting, ask your health care provider.  Do not drink alcohol until your health care provider approves. Take a daily multivitamin and keep a high protein diet for wound healing  DO NOT HAVE INTERCOURSE UNTIL YOU ARE INSTRUCTED THAT IT IS SAFE TO DO SO  Post operative appointments need to be scheduled at 2, 6 and 10 weeks.  You can come anytime before these with any concerns.   Discussed and reviewed with patient  risks with early intercourse or use of any foreign objects (externally or internally) can increase your risk including but not limited to the risk of vaginal cuff separation and or infection, risks for bowel involvement, risk for emergent surgery, and hospital admission with need for antibiotics.  Discussed in cases with cuff separation and bowel involvement there may be the need for colostomy placement as well.  In no situation should she have intercourse unless cleared to do so.  This can be anywhere from 10 weeks or longer after surgery.  General instructions YOU MAY TAKE SHOWERS ONLY FOR 2 WEEKS AFTER SURGERY, THEN YOU MAY USE TUBS AND HOT TUBS OR SWIM AFTER THAT Do not douche, use tampons, or have sex for at least 10 weeks, or possibly longer. You will need to have an exam done in the office to be cleared to have intercourse. If you struggle with physical or emotional changes after your procedure, speak with your health care provider or a therapist.  IF YOU HAVE BURNING WITH URINATION, PLEASE CALL YOUR DOCTOR. BLADDER INFECTIONS MAY OCCUR AFTER SURGERY Try to have someone at home with you for the first week to help with your daily chores.  Most patients are driving by the end of the first week after the robotic hysterectomy. Wear compression stockings as told by your health care provider. These stockings help to prevent blood clots and reduce swelling in your legs. Keep all follow-up visits. This is important. Contact a health care provider if: You have any of these signs of infection: Chills or a fever 182f OR GREATER. More redness, swelling, or pain around an incision. Fluid or blood coming from an incision. Warmth coming from an incision. Pus or a bad smell coming from an incision. Burning with urination. Urinary frequency or cramping.   IF YOU HAVE THESE SYMPTOMS, PLEASE CALL THE OFFICE TO COME EVALUATE FOR A BLADDER INFECTION AT [phone] An incision opens. You feel dizzy or  light-headed. You have pain or bleeding when you urinate, or you are unable to urinate. You have abnormal vaginal discharge. You have pain that does not get better with medicine. Get help right away if: You have a fever and your symptoms suddenly get worse. You have severe abdominal pain. Heavy vaginal bleeding, like a period You have chest pain or shortness of breath. You may have chest pain and shortness of breath from the CO2 gas for a few days after surgery.  This is very common.  Walking, Gas-X and motrin will usually help relieve this discomfort You faint. You have pain, swelling, or redness in your leg.  These symptoms may represent a serious problem that is an emergency. Do not wait to see if the symptoms will go away. Get medical help right away. Call your local emergency services (911 in the [place] Do not drive yourself to the hospital. Summary  CONSTIPATION MEDICATION AFTER SURGERY: COLACE, MOM, MIRALAX, GAS X are all helpful to have on hand, if needed.  FILL ALL POSTOP MEDICATION BEFORE SURGERY

## 2023-12-18 ENCOUNTER — Ambulatory Visit: Payer: Self-pay | Admitting: Obstetrics and Gynecology

## 2023-12-31 ENCOUNTER — Encounter: Payer: Self-pay | Admitting: *Deleted

## 2024-01-05 ENCOUNTER — Encounter (HOSPITAL_COMMUNITY): Payer: Self-pay | Admitting: Obstetrics and Gynecology

## 2024-01-05 NOTE — Progress Notes (Signed)
 Spoke w/ via phone for pre-op interview--- pt Lab needs dos----  no       Lab results------ lab appt 01-06-2024 @ 1300 getting CBC/ BMP/ T&S/ EKG COVID test -----patient states asymptomatic no test needed Arrive at ------- 1230 on 01-08-2024 NPO after MN NO Solid Food.  Clear liquids from MN until--- 1130 Pre-Surgery Ensure or G2: n/a  Med rec completed Medications to take morning of surgery ----- prilosec, toprol  Diabetic medication ----- n/a  GLP1 agonist last dose: pt stated last lose 11-21-2023  due gi illness the was constipated so never started back waiting until after surgery GLP1 instructions:  Patient instructed no nail polish to be worn day of surgery Patient instructed to bring photo id and insurance card day of surgery Patient aware to have Driver (ride ) / caregiver    for 24 hours after surgery - husband, Angela West Patient Special Instructions ----- will pick up soap and written instructions at lab appt Pre-Op special Instructions ----- n/a  Patient verbalized understanding of instructions that were given at this phone interview. Patient denies chest pain, sob, fever, cough at the interview.

## 2024-01-05 NOTE — Pre-Procedure Instructions (Signed)
 Surgical Instructions  Your procedure is scheduled on :   Friday,  01-08-2024 Report to [facility] Main Entrance A at 12:30 PM, then check in the Admitting office. Any questions or running late day of surgery :  call [phone]  Questions prior to your surgery day:  call [phone], Monday -- Friday 8am - 4pm. If you experience any cold or flu symptoms such as cough, fever, chills, shortness of breath, etc. between now and you scheduled surgery, please notify your surgeon office.   Remember: Do not eat any food after midnight the night before surgery. You may have clear liquids from midnight night before surgery until 11:30 AM.   Clear liquids allowed are:  Water              Carbonated Beverages (diabetics choose diet or no sugar options)  Clear Tea ( no milk, no honey, etc.)  Black coffee ( NO MILK, CREAM OR POWDERED CREAMER OF ANY KIND)  Sport drinks, like Gatorade (diabetes choose diet or no sugar options)  NO clear liquid after 11:30 AM day of surgery.  This includes No water ,  candy,  gum, and mints.  Take these medicines the morning of surgery with A SIPS OF WATER :  Metoprolol  (toprol ) Omeprazole (prilosec)    May take these medicines IF NEEDED:   Gabapentin  (neurontin ) Cyclobenzaprine  (flexeril )   One week prior to surgery, STOP taking any Aspirin  (unless otherwise instructed by your surgeon) Aleve, Naproxen, ibuprofen, Motrin, Advil, Goody's, BC's, all herbal medications/ supplements, fish oil, and non-prescription vitamins.  Do NOT Smoke (tobacco/ vaping) and Do Not drink alcohol for 24 hours prior to your procedure.  For those patients that use a CPAP.  Please bring your CPAP/ mask/ tubing with them day of surgery . Anesthesia may ask recovery room nurse to use and if you stay the night you be asked to use it.  You will be asked to removed any contacts, glasses, piercing's, hearing aid's, dentures/ partials prior to surgery.  Please bring cases/ container/ solution/  etc., for them day of surgery.   Patients discharged the day of surgery will NOT be allowed to drive home.  You must have responsible driver and caregiver to stay at home with you the next 24 hours.  SURGICAL WAITING ROOM VISITATION Patients may have no more than 2 support people in the waiting area - if more than 2 , these visitors may rotate.  Pre-op nurse will coordinate an appropriate time for 1 Adult support person, who may not rotate, to accompany patient in pre-op.  Aware some patients may have certain circumstances, speak to pre-op nurse day of surgery.  Children under the age 35 must have an adult with them who is not the patient and must remain in the main waiting area with an adult.  If the patient needs to stay at the hospital during part of their recovery, the visitor guidelines for inpatient rooms apply.  Please refer to the [facility] website for the visitor guidelines for any additional information.  If you received a COVID test during your pre-op visit it is requested that you wear a mask when out in public, stay away from anyone that may not be feeling well and notify your surgeon if you develop symptoms.  If you have been in contact with anyone that has tested positive in the past 10 days notify your surgeon.     [facility] - Preparing for Surgery  Before surgery, you can play an important role. Because skin  is not sterile, it needs to be as free of germs as possible. You can reduce the number of germs on your skin by washing with CHG (chlorhexidine  gluconate) soap before surgery. CHG is an antiseptic cleaner which kills germs and bonds with the skin to continue killing germs even after washing. Oral hygiene is also important in reducing the risk of infection. Remember to brush your teeth with your regular toothpaste the morning of surgery.  Please DO NOT use if you have an allergy to CHG or antibacterial soaps. If your skin becomes reddened/irritated stop using the CHG and  inform your Pre-op nurse day of surgery.  DO NOT shave (including legs and genital area) for at least 48 hours prior to your CHG shower.   Please follow these instructions carefully:  Shower with CHG soap the night before surgery. If you choose to wash your hair, wash your hair first as usual with your normal shampoo. After you shampoo, rinse your hair and body thoroughly to remove the shampoo. Use CHG as you would any other liquid soap. You can apply CHG directly to the skin and wash gently with a clean washcloth or shower sponge. Apply the CHG soap to your body ONLY FROM THE NECK DOWN. Do not use on open wounds or open sores. Avoid contact with your eyes, ears, mouth, and genitals (private parts). Wash genitals (private parts) with your normal soap. Wash thoroughly, paying special attention to the area where your surgery will be performed. Thoroughly rinse your body with warm water  from the neck down. DO NOT shower/wash with your normal soap after using and rinsing off the CHG soap. DO NOT use lotions, oils, etc., after showering with CHG. Pat yourself dry with a clean towel. Wear clean pajamas. Place clean sheets on your bed the night of your CHG shower and do not sleep with pets.  Day of Surgery  DO NOT Apply any lotions,  powder,  oils,  deodorants (may use underarm deodorant),  cologne/  perfumes  or makeup Do Not wear jewelry /  piercing's/  metal/  permanent jewelry must be removed prior to arrival day of surgery. (No plastic piercing) Do Not wear nail polish,  gel polish,  artificial nails, or any other type of covering on natural finger nails (toe nails are okay) Remember to brush your teeth and rinse mouth out. Put on clean / comfortable clothes. [facility] is not responsible for valuables/ personal belongings

## 2024-01-06 ENCOUNTER — Ambulatory Visit: Payer: Self-pay | Admitting: Obstetrics and Gynecology

## 2024-01-06 ENCOUNTER — Encounter (HOSPITAL_COMMUNITY)
Admission: RE | Admit: 2024-01-06 | Discharge: 2024-01-06 | Disposition: A | Discharge status: Home / Self Care | Source: Ambulatory Visit | Attending: Obstetrics and Gynecology | Admitting: Obstetrics and Gynecology

## 2024-01-06 DIAGNOSIS — Z01818 Encounter for other preprocedural examination: Secondary | ICD-10-CM | POA: Insufficient documentation

## 2024-01-06 LAB — BASIC METABOLIC PANEL WITH GFR
Anion gap: 11 (ref 5–15)
BUN: 13 mg/dL (ref 8–23)
CO2: 20 mmol/L — ABNORMAL LOW (ref 22–32)
Calcium: 9 mg/dL (ref 8.9–10.3)
Chloride: 105 mmol/L (ref 98–111)
Creatinine, Ser: 0.79 mg/dL (ref 0.44–1.00)
GFR, Estimated: >60 mL/min (ref >60)
Glucose, Bld: 120 mg/dL — ABNORMAL HIGH (ref 70–99)
Potassium: 3.8 mmol/L (ref 3.5–5.1)
Sodium: 136 mmol/L (ref 135–145)

## 2024-01-06 LAB — CBC
HCT: 38.9 % (ref 36.0–46.0)
Hemoglobin: 13.5 g/dL (ref 12.0–15.0)
MCH: 32.5 pg (ref 26.0–34.0)
MCHC: 34.7 g/dL (ref 30.0–36.0)
MCV: 93.7 fL (ref 80.0–100.0)
Platelets: 343 K/uL (ref 150–400)
RBC: 4.15 MIL/uL (ref 3.87–5.11)
RDW: 13.6 % (ref 11.5–15.5)
WBC: 7.5 K/uL (ref 4.0–10.5)
nRBC: 0 % (ref 0.0–0.2)

## 2024-01-06 LAB — TYPE AND SCREEN
ABO/RH(D): A POS
Antibody Screen: NEGATIVE

## 2024-01-07 MED ORDER — GENTAMICIN SULFATE 40 MG/ML IJ SOLN
5.0000 mg/kg | INTRAVENOUS | Status: AC
  Administered 2024-01-08: 370 mg via INTRAVENOUS
  Filled 2024-01-07: qty 9.25

## 2024-01-07 MED ORDER — CLINDAMYCIN PHOSPHATE 900 MG/50ML IV SOLN
900.0000 mg | INTRAVENOUS | Status: AC
  Administered 2024-01-08: 900 mg via INTRAVENOUS
  Filled 2024-01-07: qty 50

## 2024-01-07 NOTE — Anesthesia Preprocedure Evaluation (Signed)
 Anesthesia Evaluation  Patient identified by MRN, date of birth, ID band Patient awake    Reviewed: Allergy & Precautions, NPO status , Patient's Chart, lab work & pertinent test results  History of Anesthesia Complications Negative for: history of anesthetic complications  Airway Mallampati: II  TM Distance: >3 FB Neck ROM: Full    Dental  (+) Dental Advisory Given   Pulmonary neg shortness of breath, neg sleep apnea, neg COPD, neg recent URI, Current SmokerPatient did not abstain from smoking.   Pulmonary exam normal breath sounds clear to auscultation       Cardiovascular hypertension (amlodipine, losartan , metoprolol ), Pt. on medications and Pt. on home beta blockers (-) angina (-) Past MI, (-) Cardiac Stents and (-) CABG (-) dysrhythmias  Rhythm:Regular Rate:Normal  HLD   Neuro/Psych neg Seizures PSYCHIATRIC DISORDERS Anxiety        GI/Hepatic Neg liver ROS, PUD,GERD  Medicated,,IBS   Endo/Other  Pre-diabetes  Renal/GU negative Renal ROS     Musculoskeletal  (+) Arthritis , Osteoarthritis,  Osteoporosis    Abdominal  (+) + obese  Peds  Hematology negative hematology ROS (+) Lab Results      Component                Value               Date                      WBC                      7.5                 01/06/2024                HGB                      13.5                01/06/2024                HCT                      38.9                01/06/2024                MCV                      93.7                01/06/2024                PLT                      343                 01/06/2024              Anesthesia Other Findings Last Zepbound : stopped in September  Reproductive/Obstetrics Fibroid                               Anesthesia Physical Anesthesia Plan  ASA: 3  Anesthesia Plan: General   Post-op Pain Management: Tylenol  PO (pre-op)*   Induction:  Intravenous  PONV Risk Score and Plan: 2 and Ondansetron , Dexamethasone , Midazolam   and Treatment may vary due to age or medical condition  Airway Management Planned: Oral ETT  Additional Equipment:   Intra-op Plan:   Post-operative Plan: Extubation in OR  Informed Consent: I have reviewed the patients History and Physical, chart, labs and discussed the procedure including the risks, benefits and alternatives for the proposed anesthesia with the patient or authorized representative who has indicated his/her understanding and acceptance.     Dental advisory given  Plan Discussed with: CRNA and Anesthesiologist  Anesthesia Plan Comments: (Risks of general anesthesia discussed including, but not limited to, sore throat, hoarse voice, chipped/damaged teeth, injury to vocal cords, nausea and vomiting, allergic reactions, lung infection, heart attack, stroke, and death. All questions answered. )         Anesthesia Quick Evaluation

## 2024-01-08 ENCOUNTER — Other Ambulatory Visit: Payer: Self-pay

## 2024-01-08 ENCOUNTER — Ambulatory Visit (HOSPITAL_COMMUNITY): Payer: Self-pay | Admitting: Anesthesiology

## 2024-01-08 ENCOUNTER — Encounter (HOSPITAL_COMMUNITY): Admission: RE | Disposition: A | Payer: Self-pay | Source: Home / Self Care | Attending: Obstetrics and Gynecology

## 2024-01-08 ENCOUNTER — Ambulatory Visit (HOSPITAL_COMMUNITY)
Admission: RE | Admit: 2024-01-08 | Discharge: 2024-01-08 | Disposition: A | Discharge status: Home / Self Care | Attending: Obstetrics and Gynecology | Admitting: Obstetrics and Gynecology

## 2024-01-08 ENCOUNTER — Encounter (HOSPITAL_COMMUNITY): Payer: Self-pay | Admitting: Obstetrics and Gynecology

## 2024-01-08 DIAGNOSIS — D271 Benign neoplasm of left ovary: Secondary | ICD-10-CM | POA: Insufficient documentation

## 2024-01-08 DIAGNOSIS — N838 Other noninflammatory disorders of ovary, fallopian tube and broad ligament: Secondary | ICD-10-CM | POA: Insufficient documentation

## 2024-01-08 DIAGNOSIS — Z6832 Body mass index (BMI) 32.0-32.9, adult: Secondary | ICD-10-CM | POA: Insufficient documentation

## 2024-01-08 DIAGNOSIS — R102 Pelvic and perineal pain unspecified side: Secondary | ICD-10-CM | POA: Diagnosis not present

## 2024-01-08 DIAGNOSIS — D270 Benign neoplasm of right ovary: Secondary | ICD-10-CM | POA: Insufficient documentation

## 2024-01-08 DIAGNOSIS — Z79899 Other long term (current) drug therapy: Secondary | ICD-10-CM | POA: Diagnosis not present

## 2024-01-08 DIAGNOSIS — K279 Peptic ulcer, site unspecified, unspecified as acute or chronic, without hemorrhage or perforation: Secondary | ICD-10-CM | POA: Insufficient documentation

## 2024-01-08 DIAGNOSIS — D259 Leiomyoma of uterus, unspecified: Secondary | ICD-10-CM | POA: Insufficient documentation

## 2024-01-08 DIAGNOSIS — Z780 Asymptomatic menopausal state: Secondary | ICD-10-CM | POA: Insufficient documentation

## 2024-01-08 DIAGNOSIS — K219 Gastro-esophageal reflux disease without esophagitis: Secondary | ICD-10-CM | POA: Diagnosis not present

## 2024-01-08 DIAGNOSIS — E7800 Pure hypercholesterolemia, unspecified: Secondary | ICD-10-CM

## 2024-01-08 DIAGNOSIS — Z791 Long term (current) use of non-steroidal anti-inflammatories (NSAID): Secondary | ICD-10-CM | POA: Diagnosis not present

## 2024-01-08 DIAGNOSIS — R7303 Prediabetes: Secondary | ICD-10-CM | POA: Insufficient documentation

## 2024-01-08 DIAGNOSIS — M1990 Unspecified osteoarthritis, unspecified site: Secondary | ICD-10-CM | POA: Diagnosis not present

## 2024-01-08 DIAGNOSIS — N83202 Unspecified ovarian cyst, left side: Secondary | ICD-10-CM

## 2024-01-08 DIAGNOSIS — F419 Anxiety disorder, unspecified: Secondary | ICD-10-CM | POA: Insufficient documentation

## 2024-01-08 DIAGNOSIS — M810 Age-related osteoporosis without current pathological fracture: Secondary | ICD-10-CM | POA: Insufficient documentation

## 2024-01-08 DIAGNOSIS — F17210 Nicotine dependence, cigarettes, uncomplicated: Secondary | ICD-10-CM

## 2024-01-08 DIAGNOSIS — I10 Essential (primary) hypertension: Secondary | ICD-10-CM

## 2024-01-08 DIAGNOSIS — D219 Benign neoplasm of connective and other soft tissue, unspecified: Secondary | ICD-10-CM

## 2024-01-08 DIAGNOSIS — E669 Obesity, unspecified: Secondary | ICD-10-CM | POA: Diagnosis not present

## 2024-01-08 DIAGNOSIS — Z01818 Encounter for other preprocedural examination: Secondary | ICD-10-CM

## 2024-01-08 DIAGNOSIS — K589 Irritable bowel syndrome without diarrhea: Secondary | ICD-10-CM | POA: Diagnosis not present

## 2024-01-08 DIAGNOSIS — N8003 Adenomyosis of the uterus: Secondary | ICD-10-CM | POA: Diagnosis not present

## 2024-01-08 HISTORY — DX: Simple chronic bronchitis: J41.0

## 2024-01-08 HISTORY — DX: Dysmenorrhea, unspecified: N94.6

## 2024-01-08 HISTORY — DX: Anxiety disorder, unspecified: F41.9

## 2024-01-08 HISTORY — DX: Allergic rhinitis, unspecified: J30.9

## 2024-01-08 HISTORY — PX: ROBOTIC ASSISTED TOTAL HYSTERECTOMY WITH BILATERAL SALPINGO OOPHERECTOMY: SHX6086

## 2024-01-08 HISTORY — DX: Personal history of peptic ulcer disease: Z87.11

## 2024-01-08 HISTORY — DX: Other constipation: K59.09

## 2024-01-08 HISTORY — DX: Hyperlipidemia, unspecified: E78.5

## 2024-01-08 HISTORY — PX: CYSTOSCOPY: SHX5120

## 2024-01-08 HISTORY — DX: Excessive and frequent menstruation with regular cycle: N92.0

## 2024-01-08 SURGERY — HYSTERECTOMY, TOTAL, ROBOT-ASSISTED, LAPAROSCOPIC, WITH BILATERAL SALPINGO-OOPHORECTOMY
Anesthesia: General | Site: Pelvis

## 2024-01-08 MED ORDER — POVIDONE-IODINE 10 % EX SWAB
2.0000 | Freq: Once | CUTANEOUS | Status: AC
  Administered 2024-01-08: 2 via TOPICAL

## 2024-01-08 MED ORDER — SCOPOLAMINE 1 MG/3DAYS TD PT72
MEDICATED_PATCH | TRANSDERMAL | Status: AC
  Filled 2024-01-08: qty 1

## 2024-01-08 MED ORDER — PROPOFOL 10 MG/ML IV BOLUS
INTRAVENOUS | Status: AC
  Filled 2024-01-08: qty 20

## 2024-01-08 MED ORDER — PANTOPRAZOLE SODIUM 40 MG IV SOLR
40.0000 mg | Freq: Every day | INTRAVENOUS | Status: DC
  Filled 2024-01-08: qty 10

## 2024-01-08 MED ORDER — ACETAMINOPHEN 500 MG PO TABS
1000.0000 mg | ORAL_TABLET | ORAL | Status: AC
  Administered 2024-01-08: 1000 mg via ORAL

## 2024-01-08 MED ORDER — ROCURONIUM BROMIDE 10 MG/ML (PF) SYRINGE
PREFILLED_SYRINGE | INTRAVENOUS | Status: AC
Start: 2024-01-08 — End: 2024-01-08
  Filled 2024-01-08: qty 10

## 2024-01-08 MED ORDER — SCOPOLAMINE 1 MG/3DAYS TD PT72
1.0000 | MEDICATED_PATCH | TRANSDERMAL | Status: DC
  Administered 2024-01-08: 1 mg via TRANSDERMAL

## 2024-01-08 MED ORDER — IBUPROFEN 600 MG PO TABS
600.0000 mg | ORAL_TABLET | Freq: Four times a day (QID) | ORAL | Status: DC
  Filled 2024-01-08: qty 1

## 2024-01-08 MED ORDER — LIDOCAINE 2% (20 MG/ML) 5 ML SYRINGE
INTRAMUSCULAR | Status: AC
  Filled 2024-01-08: qty 5

## 2024-01-08 MED ORDER — ONDANSETRON HCL 4 MG/2ML IJ SOLN
INTRAMUSCULAR | Status: DC | PRN
  Administered 2024-01-08: 4 mg via INTRAVENOUS

## 2024-01-08 MED ORDER — SUCCINYLCHOLINE CHLORIDE 200 MG/10ML IV SOSY
PREFILLED_SYRINGE | INTRAVENOUS | Status: AC
  Filled 2024-01-08: qty 10

## 2024-01-08 MED ORDER — MIDAZOLAM HCL 2 MG/2ML IJ SOLN
INTRAMUSCULAR | Status: AC
Start: 2024-01-08 — End: 2024-01-08
  Filled 2024-01-08: qty 2

## 2024-01-08 MED ORDER — HEMOSTATIC AGENTS (NO CHARGE) OPTIME
TOPICAL | Status: DC | PRN
  Administered 2024-01-08: 1 via TOPICAL

## 2024-01-08 MED ORDER — ONDANSETRON HCL 4 MG/2ML IJ SOLN: 4.0000 mg | Freq: Four times a day (QID) | INTRAMUSCULAR | Status: DC | PRN

## 2024-01-08 MED ORDER — LIDOCAINE 2% (20 MG/ML) 5 ML SYRINGE
INTRAMUSCULAR | Status: DC | PRN
  Administered 2024-01-08: 100 mg via INTRAVENOUS

## 2024-01-08 MED ORDER — FENTANYL CITRATE (PF) 100 MCG/2ML IJ SOLN
INTRAMUSCULAR | Status: AC
  Filled 2024-01-08: qty 2

## 2024-01-08 MED ORDER — PHENYLEPHRINE 80 MCG/ML (10ML) SYRINGE FOR IV PUSH (FOR BLOOD PRESSURE SUPPORT)
PREFILLED_SYRINGE | INTRAVENOUS | Status: DC | PRN
  Administered 2024-01-08: 40 ug via INTRAVENOUS
  Administered 2024-01-08 (×4): 80 ug via INTRAVENOUS
  Administered 2024-01-08: 40 ug via INTRAVENOUS
  Administered 2024-01-08 (×2): 80 ug via INTRAVENOUS

## 2024-01-08 MED ORDER — ROCURONIUM BROMIDE 100 MG/10ML IV SOLN
INTRAVENOUS | Status: DC | PRN
  Administered 2024-01-08: 60 mg via INTRAVENOUS
  Administered 2024-01-08: 10 mg via INTRAVENOUS

## 2024-01-08 MED ORDER — SODIUM CHLORIDE 0.9 % IV SOLN
Freq: Once | INTRAVENOUS | Status: DC
  Filled 2024-01-08 (×2): qty 10

## 2024-01-08 MED ORDER — PROPOFOL 10 MG/ML IV BOLUS
INTRAVENOUS | Status: DC | PRN
  Administered 2024-01-08 (×2): 20 mg via INTRAVENOUS
  Administered 2024-01-08: 160 mg via INTRAVENOUS

## 2024-01-08 MED ORDER — ACETAMINOPHEN 500 MG PO TABS
ORAL_TABLET | ORAL | Status: AC
  Filled 2024-01-08: qty 2

## 2024-01-08 MED ORDER — PANTOPRAZOLE SODIUM 40 MG PO TBEC
40.0000 mg | DELAYED_RELEASE_TABLET | Freq: Every day | ORAL | Status: DC
  Filled 2024-01-08: qty 1

## 2024-01-08 MED ORDER — LACTATED RINGERS IV SOLN: INTRAVENOUS | Status: DC

## 2024-01-08 MED ORDER — DEXAMETHASONE SOD PHOSPHATE PF 10 MG/ML IJ SOLN
INTRAMUSCULAR | Status: DC | PRN
  Administered 2024-01-08: 10 mg via INTRAVENOUS

## 2024-01-08 MED ORDER — CHLORHEXIDINE GLUCONATE 0.12 % MT SOLN
OROMUCOSAL | Status: AC
  Filled 2024-01-08: qty 15

## 2024-01-08 MED ORDER — OXYCODONE HCL 5 MG PO TABS: 5.0000 mg | ORAL_TABLET | ORAL | Status: DC | PRN

## 2024-01-08 MED ORDER — ONDANSETRON HCL 4 MG/2ML IJ SOLN
INTRAMUSCULAR | Status: AC
  Filled 2024-01-08: qty 2

## 2024-01-08 MED ORDER — OXYCODONE HCL 5 MG/5ML PO SOLN: 5.0000 mg | Freq: Once | ORAL | Status: DC | PRN

## 2024-01-08 MED ORDER — GABAPENTIN 100 MG PO CAPS
100.0000 mg | ORAL_CAPSULE | Freq: Once | ORAL | Status: DC
  Filled 2024-01-08: qty 1

## 2024-01-08 MED ORDER — ACETAMINOPHEN 325 MG PO TABS: 650.0000 mg | ORAL_TABLET | ORAL | Status: DC | PRN

## 2024-01-08 MED ORDER — AMISULPRIDE (ANTIEMETIC) 5 MG/2ML IV SOLN: 10.0000 mg | Freq: Once | INTRAVENOUS | Status: DC | PRN

## 2024-01-08 MED ORDER — OXYCODONE HCL 5 MG PO TABS: 5.0000 mg | ORAL_TABLET | Freq: Once | ORAL | Status: DC | PRN

## 2024-01-08 MED ORDER — FENTANYL CITRATE (PF) 100 MCG/2ML IJ SOLN
INTRAMUSCULAR | Status: DC | PRN
  Administered 2024-01-08 (×2): 50 ug via INTRAVENOUS
  Administered 2024-01-08: 100 ug via INTRAVENOUS

## 2024-01-08 MED ORDER — SODIUM CHLORIDE 0.9 % IR SOLN
Status: DC | PRN
  Administered 2024-01-08 (×2): 1000 mL

## 2024-01-08 MED ORDER — PHENYLEPHRINE 80 MCG/ML (10ML) SYRINGE FOR IV PUSH (FOR BLOOD PRESSURE SUPPORT)
PREFILLED_SYRINGE | INTRAVENOUS | Status: AC
  Filled 2024-01-08: qty 50

## 2024-01-08 MED ORDER — MIDAZOLAM HCL 5 MG/5ML IJ SOLN
INTRAMUSCULAR | Status: DC | PRN
  Administered 2024-01-08: 2 mg via INTRAVENOUS

## 2024-01-08 MED ORDER — HYDROMORPHONE HCL 1 MG/ML IJ SOLN: 0.2500 mg | INTRAMUSCULAR | Status: DC | PRN

## 2024-01-08 MED ORDER — 0.9 % SODIUM CHLORIDE (POUR BTL) OPTIME
TOPICAL | Status: DC | PRN
  Administered 2024-01-08: 1000 mL

## 2024-01-08 MED ORDER — HYDROMORPHONE HCL 1 MG/ML IJ SOLN: 0.2000 mg | INTRAMUSCULAR | Status: DC | PRN

## 2024-01-08 MED ORDER — GLYCOPYRROLATE PF 0.2 MG/ML IJ SOSY
PREFILLED_SYRINGE | INTRAMUSCULAR | Status: DC | PRN
  Administered 2024-01-08 (×2): .1 mg via INTRAVENOUS

## 2024-01-08 MED ORDER — GLYCOPYRROLATE PF 0.2 MG/ML IJ SOSY
PREFILLED_SYRINGE | INTRAMUSCULAR | Status: AC
Start: 2024-01-08 — End: 2024-01-08
  Filled 2024-01-08: qty 2

## 2024-01-08 MED ORDER — ONDANSETRON HCL 4 MG PO TABS: 4.0000 mg | ORAL_TABLET | Freq: Four times a day (QID) | ORAL | Status: DC | PRN

## 2024-01-08 MED ORDER — ORAL CARE MOUTH RINSE
15.0000 mL | Freq: Once | OROMUCOSAL | Status: AC
Start: 2024-01-08 — End: 2024-01-08

## 2024-01-08 MED ORDER — CLINDAMYCIN PHOSPHATE 900 MG/50ML IV SOLN
INTRAVENOUS | Status: AC
  Filled 2024-01-08: qty 50

## 2024-01-08 MED ORDER — PHENYLEPHRINE HCL-NACL 20-0.9 MG/250ML-% IV SOLN
INTRAVENOUS | Status: DC | PRN
  Administered 2024-01-08: 20 ug/min via INTRAVENOUS

## 2024-01-08 MED ORDER — EPHEDRINE 5 MG/ML INJ
INTRAVENOUS | Status: AC
  Filled 2024-01-08: qty 5

## 2024-01-08 MED ORDER — BUPIVACAINE HCL (PF) 0.5 % IJ SOLN
INTRAMUSCULAR | Status: DC | PRN
Start: 2024-01-08 — End: 2024-01-08
  Administered 2024-01-08: 16 mL

## 2024-01-08 MED ORDER — CHLORHEXIDINE GLUCONATE 0.12 % MT SOLN
15.0000 mL | Freq: Once | OROMUCOSAL | Status: AC
  Administered 2024-01-08: 15 mL via OROMUCOSAL

## 2024-01-08 SURGICAL SUPPLY — 49 items
COVER BACK TABLE 60X90IN (DRAPES) ×2 IMPLANT
COVER TIP SHEARS 8 DVNC (MISCELLANEOUS) ×2 IMPLANT
DEFOGGER SCOPE WARM SEASHARP (MISCELLANEOUS) ×2 IMPLANT
DERMABOND ADVANCED .7 DNX12 (GAUZE/BANDAGES/DRESSINGS) ×2 IMPLANT
DILATOR CANAL MILEX (MISCELLANEOUS) IMPLANT
DRAPE ARM DVNC X/XI (DISPOSABLE) ×8 IMPLANT
DRAPE COLUMN DVNC XI (DISPOSABLE) ×2 IMPLANT
DRAPE SURG IRRIG POUCH 19X23 (DRAPES) ×2 IMPLANT
DRAPE UTILITY XL STRL (DRAPES) ×2 IMPLANT
DRIVER NDL MEGA SUTCUT DVNCXI (INSTRUMENTS) ×2 IMPLANT
DRIVER NDLE MEGA SUTCUT DVNCXI (INSTRUMENTS) ×2 IMPLANT
DURAPREP 26ML APPLICATOR (WOUND CARE) ×2 IMPLANT
ELECTRODE REM PT RTRN 9FT ADLT (ELECTROSURGICAL) ×2 IMPLANT
FORCEPS PROGRASP DVNC XI (FORCEP) ×2 IMPLANT
GLOVE BIOGEL PI IND STRL 7.0 (GLOVE) ×4 IMPLANT
GLOVE NEODERM STER SZ 7 (GLOVE) ×6 IMPLANT
GOWN STRL REUS W/ TWL LRG LVL3 (GOWN DISPOSABLE) ×2 IMPLANT
HIBICLENS CHG 4% 4OZ BTL (MISCELLANEOUS) IMPLANT
IRRIGATION SUCT STRKRFLW 2 WTP (MISCELLANEOUS) ×2 IMPLANT
IV 0.9% NACL 1000 ML (IV SOLUTION) IMPLANT
KIT PINK PAD W/HEAD ARM REST (MISCELLANEOUS) ×2 IMPLANT
KIT TURNOVER KIT B (KITS) ×2 IMPLANT
LEGGING LITHOTOMY PAIR STRL (DRAPES) ×2 IMPLANT
MANIFOLD NEPTUNE II (INSTRUMENTS) ×2 IMPLANT
OBTURATOR OPTICALSTD 8 DVNC (TROCAR) ×2 IMPLANT
OCCLUDER COLPOPNEUMO (BALLOONS) IMPLANT
PACK CYSTO (CUSTOM PROCEDURE TRAY) ×2 IMPLANT
PACK ROBOT WH (CUSTOM PROCEDURE TRAY) ×2 IMPLANT
PACK ROBOTIC GOWN (GOWN DISPOSABLE) ×2 IMPLANT
PAD OB MATERNITY 11 LF (PERSONAL CARE ITEMS) ×2 IMPLANT
POWDER SURGICEL 3.0 GRAM (HEMOSTASIS) IMPLANT
RUMI II 3.0CM BLUE KOH-EFFICIE (DISPOSABLE) IMPLANT
SCISSORS MNPLR CVD DVNC XI (INSTRUMENTS) ×2 IMPLANT
SEAL UNIV 5-12 XI (MISCELLANEOUS) ×6 IMPLANT
SEALER VESSEL EXT DVNC XI (MISCELLANEOUS) IMPLANT
SET CYSTO IRRIGATION (SET/KITS/TRAYS/PACK) ×2 IMPLANT
SET TUBE SMOKE EVAC HIGH FLOW (TUBING) ×2 IMPLANT
SOLN 0.9% NACL POUR BTL 1000ML (IV SOLUTION) ×2 IMPLANT
SPIKE FLUID TRANSFER (MISCELLANEOUS) ×2 IMPLANT
SUT MNCRL AB 4-0 PS2 18 (SUTURE) ×2 IMPLANT
SUT VLOC 180 0 9IN GS21 (SUTURE) ×4 IMPLANT
SYSTEM BAG RETRIEVAL 10MM (BASKET) IMPLANT
TIP ENDOSCOPIC SURGICEL (TIP) IMPLANT
TIP UTERINE 5.1X6CM LAV DISP (MISCELLANEOUS) IMPLANT
TIP UTERINE 6.7X6CM WHT DISP (MISCELLANEOUS) IMPLANT
TOWEL GREEN STERILE (TOWEL DISPOSABLE) ×2 IMPLANT
TRAP SPECIMEN MUCUS 40CC (MISCELLANEOUS) IMPLANT
TRAY FOLEY W/BAG SLVR 14FR (SET/KITS/TRAYS/PACK) ×2 IMPLANT
UNDERPAD 30X36 HEAVY ABSORB (UNDERPADS AND DIAPERS) ×2 IMPLANT

## 2024-01-08 NOTE — Op Note (Signed)
 01/08/2024   [id number]  Cy Nimrod         OPERATIVE REPORT   Preop Diagnosis: pelvic pain, fibroids, enlarging ovarian cyst in menopause Procedure: robotic hysterectomy, BSO, cystoscopy, pelvic washings   Surgeon: Dr. Almarie Rollo Carpen Assistant:  Circulator: [place], Hadassah Buel RAMAN, RN Physician Assistant: Nicholaus Jorene BRAVO, PA-C Relief Scrub: Clarisse Gander A Scrub Person: Janell, Marina  CINDI Rubin Hadassah CHRISTELLA Circulator Assistant: Verdon Seretha SAILOR, RN    Fluids: please see anesthesia report   Complications: None Anesthesia: General     Findings:  fibroid 6cm uterus, enlarged ovaries and tubes, 5cm left ovarian cyst.  Cystoscopy at the end of the case with normal bladder and patent ureters bilaterally.   Estimated blood loss: Minimal <25cc   Specimens: Uterus, cervix and bilateral tubes and ovaries, pelvic washings   Disposition of specimen: Pathology           Patient is taken to the operating room. She is placed in the supine position. She is a running IV in place. Informed consent was present on the chart. SCDs on her lower extremities and functioning properly. Patient was positioned while she was awake.  Her legs were placed in the low lithotomy position in [place] stirrups. Her arms were tucked by the side.  General endotracheal anesthesia was administered by the anesthesia staff without difficulty.       Dura prep was then used to prep the abdomen and Hibiclens  was used to prep the inner thighs, perineum and vagina. Once 3 minutes had past the patient was draped in a normal standard fashion. A proper time out was performed and everyone agreed.  The legs were lifted to the high lithotomy position. A bivalve speculum was inserted into the vagina and the anterior lip of the cervix was grasped with single-tooth tenaculum.  The uterus sounded to  6 cm. Pratt dilators were used to dilate the cervix.  The RUMI uterine manipulator was obtained inserted into the  endometrial cavity and the bulb of the disposable tip was inflated with 8 cc of normal saline. There was a good fit of the KOH ring around the cervix. The tenaculum and bivavle speculum was removed. There is also good manipulation of the uterus.  A Foley catheter was placed to straight drain.  Clear urine was noted. Legs were lowered to the low lithotomy position and attention was turned the abdomen.   Superior to the umbilicus, marcaine  0.25% used to anesthetize the skin.  Using #11 blade, 8mm skin incision was made.  The 8mm robotic trocar and sleeve was inserted under direct visualization.  CO2 gas was  started and patient was placed in trendelenburg position.  Two additional 8mm ports were placed under direct visualization in the left and right lower quadrant.     Ureters were identifies.  Attention was turned to the left side.  The left IP ligament was noted away from the ureters and this was desiccated and transected with the vessel sealer.  The uterine ovarian pedicle was serially clamped cauterized and incised. Left round ligament was serially clamped cauterized and incised. The anterior and posterior peritoneum of the inferior leaf of the broad ligament were opened. The beginning of the bladder flap was created.  The bladder was taken down below the level of the KOH ring. The left uterine artery skeletonized and then just superior to the KOH ring this vessel was serially clamped, cauterized, and incised.   Attention was turned the right side.  The uterus was  placed on stretch to the opposite side.    The right IP ligament was away from the ureter and this was desiccated and transected with the vessel sealer.  Then the right uterine ovarian pedicle was serially clamped cauterized and incised. Next the right round ligament was serially clamped cauterized and incised. The anterior posterior peritoneum of the inferiorly for the broad ligament were opened. The anterior peritoneum was carried across to the  dissection on the left side. The remainder of the bladder flap was created using sharp dissection. The bladder was well below the level of the KOH ring. The right uterine artery skeletonized. Then the right uterine artery, above the level of the KOH ring, was serially clamped cauterized and incised. The uterus was devascularized at this point.   The colpotomy was performed.  This was carried around a circumferential fashion until the vaginal mucosa was completely incised in the specimen was freed.  The specimen was then delivered to the vagina. Left ovary and tube was bagged and sent to pathology for frozen and initial impression was benign.  A vaginal occlusive device was used to maintain the pneumoperitoneum   Instruments were changed with a needle driver and prograsp.  Using a 9 inch  zero V-lock suture, the cuff was closed by incorporating the anterior and posterior vaginal mucosa in each stitch. This was carried across all the way to the left corner and a running fashion. Two stitches were brought back towards the midline and the suture was cut flush with the vagina. The needle was brought out the pelvis. The pelvis was irrigated. All pedicles were inspected. No bleeding was noted.   CO2 pressures were lowered to 8mm Hg.  Again, no bleeding was noted.  Ureters were noted deep in the pelvis to be peristalsing.  At this point the procedure was completed.  The remaining instruments were removed.  The ports were removed under direct visualization of the laparoscope and the pneumoperitoneum was relieved.   The skin was then closed with subcuticular stitches of 3-0 Vicryl. The skin was cleansed Dermabond was applied. Attention was then turned the vagina and the cuff was inspected. No bleeding was noted.  The Foley catheter was removed.  Cystoscopy was performed.  No sutures or bladder injuries were noted.  Ureters were noted with normal urine jets from each one was seen.  Foley was left out after the  cystoscopic fluid was drained and cystoscope removed.  Sponge, lap, needle, instrument counts were correct x2. Patient tolerated the procedure very well. She was awakened from anesthesia, extubated and taken to recovery in stable condition.      Dr. Glennon

## 2024-01-08 NOTE — H&P (Signed)
 e Visit 12/17/2023 [facility], Almarie POUR, MD Obstetrics and Gynecology Preop examination +2 more Dx Consult ; Referred by Lyle Wells Louder, FNP Reason for Visit   Additional Documentation  Vitals: BP 118/70   Pulse 73   Wt 73.8 kg   LMP 12/27/2010 (Exact Date)   SpO2 97%   BMI 31.23 kg/m   BSA 1.78 m  Flowsheets: NEWS,   MEWS Score,   Vital Signs,   Anthropometrics  Encounter Info: Billing Info,   History,   Allergies,   Detailed Report   All Notes   Progress Notes by Glennon Almarie POUR, MD at 12/17/2023 11:30 AM  Author: Glennon Almarie POUR, MD Author Type: Physician Filed: 12/17/2023  2:22 PM  Note Status: Signed Cosign: Cosign Not Required Encounter Date: 12/17/2023  Editor: Glennon Almarie POUR, MD (Physician)             Expand All Collapse All Patient presents today for PREOP for RLH, BSO, cystoscopy   Patient with enlarging ovarian cyst in menopause and pain over the left side.   PUS results   6.37cm uterus Multiple fibroids measuring 4.78, 1.78, 1.33, 1.40, 2.05, 1.39, 1.49, 0.86, 2.95, 1.57cm   Thin EML 2.67mm Left ovary with 5.6cm x 4.5cm  simple avascular cyst (increase in size from previous scan) Prior US  4.3 x 3.6 x 3.3cm   No adnexal masses No free fluid   Ca125 (8) in 2023   Patient reports she did feel pain over the left ovary during the ultrasound.      PATHOLOGY SURGICAL PATHOLOGY CASE: [id number] PATIENT: Florella Peitz Surgical Pathology Report     Clinical History: cyst of left ovary, fibroids (cf)     FINAL MICROSCOPIC DIAGNOSIS:  A. ENDOMETRIUM, BIOPSY: Abundant mucus with scant benign, atrophic squamous and endocervical mucosa. No endometrial tissue identified.  GROSS DESCRIPTION:  Specimen is received in formalin and consists of a 2.1 x 1.8 x 0.4 cm aggregate of tan soft tissue and mucus.  The specimen is entirely submitted in 1 cassette.  (KL  11/23/2023)   Did not tolerate EMB well and low risk for cancer. Explained risks for additional surgery if endometrial cancer was found        Past Medical History:  Diagnosis Date   Abnormal Pap smear 10/1993    ASCUS   Anemia     Arthritis     Asthma      seasonal   Complication of anesthesia      Novacaine   Fibroids 12/2010    numerous   Gastritis     HPV in female     Hypercholesterolemia     Hypertension     IBS (irritable bowel syndrome)     Insomnia     Peptic ulcer     Pre-diabetes      hx of   Smoker               Past Surgical History:  Procedure Laterality Date   CERVICAL DISCECTOMY   01/28/2000   ENDOMETRIAL BIOPSY   12/2010    negative path   EYE MUSCLE SURGERY Bilateral      cataracts   FOOT SURGERY Right     KNEE ARTHROSCOPY W/ MENISCAL REPAIR Left     ROTATOR CUFF REPAIR Right 2009 or 2010   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    Procedure: TOTAL HIP ARTHROPLASTY;  Surgeon: Edna Toribio LABOR, MD;  Location: [facility];  Service: Orthopedics;  Laterality: Right;   TRIGGER FINGER RELEASE   2012    right thumb        Social History         Socioeconomic History   Marital status: Married      Spouse name: Not on file   Number of children: 0   Years of education: Not on file   Highest education level: Not on file  Occupational History   Not on file  Tobacco Use   Smoking status: Every Day      Current packs/day: 0.50      Types: Cigarettes   Smokeless tobacco: Never  Vaping Use   Vaping status: Never Used  Substance and Sexual Activity   Alcohol use: Yes      Alcohol/week: 3.0 - 4.0 standard drinks of alcohol      Types: 3 - 4 Standard drinks or equivalent per week      Comment: occ   Drug use: No   Sexual activity: Not Currently      Partners: Male      Birth control/protection: Post-menopausal      Comment: Less than 5, after 18, no STD, no DES, no abnormal pap, no hx of breast cancer  Other Topics Concern   Not on file   Social History Narrative   Not on file    Social Drivers of Health        Financial Resource Strain: Not on file  Food Insecurity: Low Risk  (02/06/2023)    Received from [facility]    Hunger Vital Sign     Within the past 12 months, you worried that your food would run out before you got money to buy more: Never true     Within the past 12 months, the food you bought just didn't last and you didn't have money to get more. : Never true  Transportation Needs: No Transportation Needs (02/06/2023)    Received from [employer]     In the past 12 months, has lack of reliable transportation kept you from medical appointments, meetings, work or from getting things needed for daily living? 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       calcium  carbonate (OS-CAL) 1250 (500 Ca) MG chewable tablet Chew 1 tablet by mouth.       Cholecalciferol  (VITAMIN D3) 50 MCG (2000 UT) CAPS Take 2,000 Units by mouth daily.        cyanocobalamin  (VITAMIN B12) 1000 MCG tablet Take 1,000 mcg by mouth daily.       cyclobenzaprine  (FLEXERIL ) 5 MG tablet Take 1 tablet (5 mg total) by mouth 3 (three) times daily as needed for pain. 90 tablet 1   cyclobenzaprine  (FLEXERIL ) 5 MG tablet Take 1 tablet (5 mg total) by mouth 3 (three) times daily as needed. 90 tablet 1   gabapentin  (NEURONTIN ) 300 MG capsule Take 300-600 mg by mouth 2 (two) times daily as needed (restless leg).       HYDROcodone -acetaminophen  (NORCO/VICODIN) 5-325 MG tablet Take 1 tablet by mouth every 6 (six) hours as needed for pain dose reduction 120 tablet 0   losartan  (COZAAR ) 50 MG tablet Take 50 mg by mouth daily.       meloxicam  (MOBIC ) 15 MG tablet Take 1 tablet (15 mg total) by mouth daily. 30 tablet 0   meloxicam  (MOBIC ) 15 MG tablet Take 1 tablet (15 mg total) by mouth daily. 90 tablet 1   metoprolol  succinate (TOPROL -XL) 50 MG 24 hr tablet Take 50 mg by mouth daily.       omeprazole (PRILOSEC) 40 MG capsule Take 40 mg by mouth.       Prasterone  (INTRAROSA ) 6.5 MG INST Place 1 suppository vaginally at bedtime as needed. 30 each 12   promethazine  (PHENERGAN ) 25 MG tablet Take 25 mg by mouth every 6 (six) hours as needed for nausea or vomiting.       rosuvastatin  (CRESTOR ) 20 MG tablet Take 20 mg by mouth daily.       alendronate (FOSAMAX) 70 MG tablet Take 70 mg by mouth. (Patient not taking: Reported on 12/17/2023)       cyclobenzaprine  (FLEXERIL ) 10 MG tablet Take 10 mg by mouth 3 (three) times daily as needed for muscle spasms.       tirzepatide  (ZEPBOUND ) 7.5 MG/0.5ML Pen Inject 7.5 mg into the skin once a week. (Patient not taking: Reported on 12/17/2023)                 Current Facility-Administered Medications on File Prior to Visit  Medication Dose Route Frequency Provider Last Rate Last Admin   denosumab  (PROLIA ) injection 60 mg  60 mg Subcutaneous Once Breawna Montenegro K, MD       denosumab -bernett SOSY 60 mg  60 mg Subcutaneous Once          Romosozumab -aqqg (EVENITY ) 105 MG/1. injection 210 mg  210 mg Subcutaneous Once Laurelin Elson K, MD          Allergies      Allergies  Allergen Reactions   Erythromycin Nausea And Vomiting   Penicillins Hives      S1S2 CTA B/L Soft, NT  Enlarging ovarian cyst in menopause, pelvic pain ASCUS pap smear, osteoporosis, preop for RLH, BSO, cystosocpy   The risks of surgery were discussed in detail with the patient including but not limited to: bleeding which may require transfusion or reoperation; infection which may require prolonged hospitalization or re-hospitalization and antibiotic therapy; injury to bowel, bladder, ureters and major vessels or  other surrounding organs which may lead to other procedures; formation of adhesions; need for additional procedures including laparotomy or subsequent procedures secondary to intraoperative injury or abnormal pathology; thromboembolic phenomenon; incisional problems and other postoperative or anesthesia complications.  The postoperative expectations were also discussed in detail. The patient also understands the alternative treatment options which were discussed in full. All questions were answered.  Patient would like to proceed with the procedure.   Prolia  was given today and she did well. Dr. Glennon   40 minutes spent on reviewing records, imaging,  and one on one patient time and counseling patient and documentation Dr. Glennon     Dr. Glennon         Patient Instructions by Glennon Almarie POUR, MD at 12/17/2023 11:30 AM  Author: Glennon Almarie POUR, MD Author Type: Physician Filed: 12/17/2023 12:20 PM  Note Status: Signed Cosign: Cosign Not Required Encounter Date: 12/17/2023  Editor: Glennon Almarie POUR, MD (Physician)                 Robotic Laparoscopic Hysterectomy, Care After   The following information offers guidance on how to care for yourself after your procedure. Your health care provider may also give you  more specific instructions. If you have problems or questions, contact your health care provider. What can I expect after the procedure? After the procedure, it is common to have: Pain, bruising, and numbness around your incisions. Tiredness (fatigue).  Abdominal bloating Poor appetite. Chest discomfort that radiates to your shoulder from the carbon dioxide gas for a few days after   Vaginal discharge or spotting. You will need to use a sanitary pad after this procedure.  HEAVY BLEEDING LIKE A PERIOD IS NOT NORMAL.  PLEASE CALL YOUR PROVIDER IF SOAKING A PAD or have copious discharge and or pain.   Feelings of sadness or other emotions.   If your ovaries were also removed, it is also common to have symptoms of menopause, such as hot flashes, night sweats, and lack of sleep (insomnia).  Ovaries should stay in if at all possible until at least the age of 48. Follow these instructions at home: Medicines Take over-the-counter and prescription medicines only as told by your health care provider. Ask your health care provider if the medicine prescribed to you: Requires you to avoid driving or using machinery. You cannot drive for 24 hours after anesthesia Can cause constipation. You may need to take these actions to prevent or treat constipation: Drink enough fluid to keep your urine pale yellow. Take over-the-counter or prescription medicines. Eat foods that are high in fiber, such as beans, whole grains, and fresh fruits and vegetables. Limit foods that are high in fat and processed sugars, such as fried or sweet foods.  Also, avoid spicy foods.  NAUSEA IS COMMON THE FIRST NIGHT OF SURGERY.  IF IT LASTS BEYOND 24 HOURS, CALL YOUR PROVIDER.  NAUSEA MEDICATION WAS GIVEN AT YOUR PREOP APPOINTMENT THAT YOU CAN TAKE AFTER SURGERY. Incision care  Follow instructions from your health care provider about how to take care of your incisions. Make sure you: LEAVE INCISION OPEN AND DRY-NO BANDAGES Leave  stitches (sutures), skin glue, or adhesive strips in place UNTIL 2 WEEKS THEN REMOVE IN THE SHOWER.  If adhesive strip edges start to loosen and curl up, you may trim the loose edges. Check your incision areas every day for signs of infection. Check for: More redness, swelling, or pain. Fluid or blood. Warmth. Pus or a bad smell.  Activity  Rest as told by your health care provider. Avoid sitting for a long time without moving. Get up to take short walks every 1-2 hours. This is important to improve blood flow and breathing. Ask for help if you feel weak or unsteady.  If you are sore or tired, rest. Return to your normal activities as told by your health care provider. Ask your health care provider what activities are safe for you. Do not lift, push or pull anything that is heavier than 13 lb (4.5 kg), or the limit that you are told, for 6 WEEKS after surgery or until your health care provider says that it is safe. If you were given a sedative during the procedure, it can affect you for several hours. Do not drive or operate machinery until your health care provider says that it is safe. Lifestyle Do not use any products that contain nicotine or tobacco. These products include cigarettes, chewing tobacco, and vaping devices, such as e-cigarettes. These can delay healing after surgery. If you need help quitting, ask your health care provider. Do not drink alcohol until your health care provider approves. Take a daily multivitamin and keep a high protein diet for wound healing   DO NOT HAVE INTERCOURSE UNTIL YOU ARE INSTRUCTED THAT IT IS SAFE TO DO SO   Post operative appointments need to be scheduled at 2, 6 and 10 weeks.  You can come anytime before these with any concerns.    Discussed and reviewed with patient risks with early intercourse or use of any foreign objects (externally or internally) can increase your risk including but not limited to the risk of vaginal cuff separation and or  infection, risks for bowel involvement, risk for emergent surgery, and hospital admission with need for antibiotics.  Discussed in cases with cuff separation and bowel involvement there may be the need for colostomy placement as well.  In no situation should she have intercourse unless cleared to do so.  This can be anywhere from 10 weeks or longer after surgery.   General instructions YOU MAY TAKE SHOWERS ONLY FOR 2 WEEKS AFTER SURGERY, THEN YOU MAY USE TUBS AND HOT TUBS OR SWIM AFTER THAT Do not douche, use tampons, or have sex for at least 10 weeks, or possibly longer. You will need to have an exam done in the office to be cleared to have intercourse. If you struggle with physical or emotional changes after your procedure, speak with your health care provider or a therapist.   IF YOU HAVE BURNING WITH URINATION, PLEASE CALL YOUR DOCTOR. BLADDER INFECTIONS MAY OCCUR AFTER SURGERY Try to have someone at home with you for the first week to help with your daily chores.  Most patients are driving by the end of the first week after the robotic hysterectomy. Wear compression stockings as told by your health care provider. These stockings help to prevent blood clots and reduce swelling in your legs. Keep all follow-up visits. This is important. Contact a health care provider if: You have any of these signs of infection: Chills or a fever 139f OR GREATER. More redness, swelling, or pain around an incision. Fluid or blood coming from an incision. Warmth coming from an incision. Pus or a bad smell coming from an incision. Burning with urination. Urinary frequency or cramping.   IF YOU HAVE THESE SYMPTOMS, PLEASE CALL THE OFFICE TO COME EVALUATE FOR A BLADDER INFECTION AT [phone] An incision opens. You feel dizzy or light-headed. You have pain or  bleeding when you urinate, or you are unable to urinate. You have abnormal vaginal discharge. You have pain that does not get better with medicine. Get  help right away if: You have a fever and your symptoms suddenly get worse. You have severe abdominal pain. Heavy vaginal bleeding, like a period You have chest pain or shortness of breath. You may have chest pain and shortness of breath from the CO2 gas for a few days after surgery.  This is very common.  Walking, Gas-X and motrin will usually help relieve this discomfort You faint. You have pain, swelling, or redness in your leg.   These symptoms may represent a serious problem that is an emergency. Do not wait to see if the symptoms will go away. Get medical help right away. Call your local emergency services (911 in the [place] Do not drive yourself to the hospital. Summary   CONSTIPATION MEDICATION AFTER SURGERY: COLACE, MOM, MIRALAX , GAS X are all helpful to have on hand, if needed.   FILL ALL POSTOP MEDICATION BEFORE SURGERY             Instructions      Robotic Laparoscopic Hysterectomy, Care After   The following information offers guidance on how to care for yourself after your procedure. Your health care provider may also give you more specific instructions. If you have problems or questions, contact your health care provider. What can I expect after the procedure? After the procedure, it is common to have: Pain, bruising, and numbness around your incisions. Tiredness (fatigue).  Abdominal bloating Poor appetite. Chest discomfort that radiates to your shoulder from the carbon dioxide gas for a few days after   Vaginal discharge or spotting. You will need to use a sanitary pad after this procedure.  HEAVY BLEEDING LIKE A PERIOD IS NOT NORMAL.  PLEASE CALL YOUR PROVIDER IF SOAKING A PAD or have copious discharge and or pain.   Feelings of sadness or other emotions.   If your ovaries were also removed, it is also common to have symptoms of menopause, such as hot flashes, night sweats, and lack of sleep (insomnia).  Ovaries should stay in if at all possible until at least  the age of 10. Follow these instructions at home: Medicines Take over-the-counter and prescription medicines only as told by your health care provider. Ask your health care provider if the medicine prescribed to you: Requires you to avoid driving or using machinery. You cannot drive for 24 hours after anesthesia Can cause constipation. You may need to take these actions to prevent or treat constipation: Drink enough fluid to keep your urine pale yellow. Take over-the-counter or prescription medicines. Eat foods that are high in fiber, such as beans, whole grains, and fresh fruits and vegetables. Limit foods that are high in fat and processed sugars, such as fried or sweet foods.  Also, avoid spicy foods.  NAUSEA IS COMMON THE FIRST NIGHT OF SURGERY.  IF IT LASTS BEYOND 24 HOURS, CALL YOUR PROVIDER.  NAUSEA MEDICATION WAS GIVEN AT YOUR PREOP APPOINTMENT THAT YOU CAN TAKE AFTER SURGERY. Incision care  Follow instructions from your health care provider about how to take care of your incisions. Make sure you: LEAVE INCISION OPEN AND DRY-NO BANDAGES Leave stitches (sutures), skin glue, or adhesive strips in place UNTIL 2 WEEKS THEN REMOVE IN THE SHOWER.  If adhesive strip edges start to loosen and curl up, you may trim the loose edges. Check your incision areas every day for signs of  infection. Check for: More redness, swelling, or pain. Fluid or blood. Warmth. Pus or a bad smell. Activity  Rest as told by your health care provider. Avoid sitting for a long time without moving. Get up to take short walks every 1-2 hours. This is important to improve blood flow and breathing. Ask for help if you feel weak or unsteady.  If you are sore or tired, rest. Return to your normal activities as told by your health care provider. Ask your health care provider what activities are safe for you. Do not lift, push or pull anything that is heavier than 13 lb (4.5 kg), or the limit that you are told, for 6 WEEKS  after surgery or until your health care provider says that it is safe. If you were given a sedative during the procedure, it can affect you for several hours. Do not drive or operate machinery until your health care provider says that it is safe. Lifestyle Do not use any products that contain nicotine or tobacco. These products include cigarettes, chewing tobacco, and vaping devices, such as e-cigarettes. These can delay healing after surgery. If you need help quitting, ask your health care provider. Do not drink alcohol until your health care provider approves. Take a daily multivitamin and keep a high protein diet for wound healing   DO NOT HAVE INTERCOURSE UNTIL YOU ARE INSTRUCTED THAT IT IS SAFE TO DO SO   Post operative appointments need to be scheduled at 2, 6 and 10 weeks.  You can come anytime before these with any concerns.    Discussed and reviewed with patient risks with early intercourse or use of any foreign objects (externally or internally) can increase your risk including but not limited to the risk of vaginal cuff separation and or infection, risks for bowel involvement, risk for emergent surgery, and hospital admission with need for antibiotics.  Discussed in cases with cuff separation and bowel involvement there may be the need for colostomy placement as well.  In no situation should she have intercourse unless cleared to do so.  This can be anywhere from 10 weeks or longer after surgery.   General instructions YOU MAY TAKE SHOWERS ONLY FOR 2 WEEKS AFTER SURGERY, THEN YOU MAY USE TUBS AND HOT TUBS OR SWIM AFTER THAT Do not douche, use tampons, or have sex for at least 10 weeks, or possibly longer. You will need to have an exam done in the office to be cleared to have intercourse. If you struggle with physical or emotional changes after your procedure, speak with your health care provider or a therapist.   IF YOU HAVE BURNING WITH URINATION, PLEASE CALL YOUR DOCTOR. BLADDER  INFECTIONS MAY OCCUR AFTER SURGERY Try to have someone at home with you for the first week to help with your daily chores.  Most patients are driving by the end of the first week after the robotic hysterectomy. Wear compression stockings as told by your health care provider. These stockings help to prevent blood clots and reduce swelling in your legs. Keep all follow-up visits. This is important. Contact a health care provider if: You have any of these signs of infection: Chills or a fever 153f OR GREATER. More redness, swelling, or pain around an incision. Fluid or blood coming from an incision. Warmth coming from an incision. Pus or a bad smell coming from an incision. Burning with urination. Urinary frequency or cramping.   IF YOU HAVE THESE SYMPTOMS, PLEASE CALL THE OFFICE TO COME EVALUATE  FOR A BLADDER INFECTION AT [phone] An incision opens. You feel dizzy or light-headed. You have pain or bleeding when you urinate, or you are unable to urinate. You have abnormal vaginal discharge. You have pain that does not get better with medicine. Get help right away if: You have a fever and your symptoms suddenly get worse. You have severe abdominal pain. Heavy vaginal bleeding, like a period You have chest pain or shortness of breath. You may have chest pain and shortness of breath from the CO2 gas for a few days after surgery.  This is very common.  Walking, Gas-X and motrin will usually help relieve this discomfort You faint. You have pain, swelling, or redness in your leg.   These symptoms may represent a serious problem that is an emergency. Do not wait to see if the symptoms will go away. Get medical help right away. Call your local emergency services (911 in the [place] Do not drive yourself to the hospital. Summary   CONSTIPATION MEDICATION AFTER SURGERY: COLACE, MOM, MIRALAX , GAS X are all helpful to have on hand, if needed.   FILL ALL POSTOP MEDICATION BEFORE SURGERY               After Visit Summary  (English Snapshot)  - Automatic Snapshot taken 12/17/2023 Communications  View All Conversations on this Encounter  [facility] Provider CC Chart Rep sent to Wells Vicci Bottcher, FNP Encounter-Level Documents on 12/17/2023:  Document on 12/17/2023 2:22 PM by Glennon Almarie POUR, MD: After Visit Summary Electronic signature on 12/13/2023 10:21 AM - E-signed Electronic signature on 12/13/2023 10:21 AM - Eugenie Bald Documents:  There are no order-level documents. Communication Routing History  Recipient Method Sent by Date Sent  Wells Vicci Bottcher, FNP Fax Glennon Almarie POUR, MD 12/17/2023  Fax: [phone] Phone: [phone] Letter: Report only

## 2024-01-08 NOTE — Discharge Instructions (Addendum)
 No acetaminophen /Tylenol  until after 7:00pm today if needed for pain.    Post Anesthesia Home Care Instructions  Activity: Get plenty of rest for the remainder of the day. A responsible individual must stay with you for 24 hours following the procedure.  For the next 24 hours, DO NOT: -Drive a car -Advertising copywriter -Drink alcoholic beverages -Take any medication unless instructed by your physician -Make any legal decisions or sign important papers.  Meals: Start with liquid foods such as gelatin or soup. Progress to regular foods as tolerated. Avoid greasy, spicy, heavy foods. If nausea and/or vomiting occur, drink only clear liquids until the nausea and/or vomiting subsides. Call your physician if vomiting continues.  Special Instructions/Symptoms: Your throat may feel dry or sore from the anesthesia or the breathing tube placed in your throat during surgery. If this causes discomfort, gargle with warm salt water . The discomfort should disappear within 24 hours.

## 2024-01-08 NOTE — Anesthesia Procedure Notes (Signed)
 Procedure Name: Intubation Date/Time: 01/08/2024 2:46 PM  Performed by: Jama Powell NOVAK, CRNAPre-anesthesia Checklist: Patient identified, Timeout performed, Emergency Drugs available, Suction available and Patient being monitored Patient Re-evaluated:Patient Re-evaluated prior to induction Oxygen Delivery Method: Circle system utilized Preoxygenation: Pre-oxygenation with 100% oxygen Induction Type: IV induction Ventilation: Mask ventilation without difficulty and Oral airway inserted - appropriate to patient size Laryngoscope Size: Mac and 3 Grade View: Grade III Tube type: Oral Tube size: 7.0 mm Number of attempts: 1 Airway Equipment and Method: Stylet Placement Confirmation: ETT inserted through vocal cords under direct vision, positive ETCO2, CO2 detector and breath sounds checked- equal and bilateral Secured at: 21 cm Tube secured with: Tape Dental Injury: Teeth and Oropharynx as per pre-operative assessment

## 2024-01-08 NOTE — Transfer of Care (Signed)
 Immediate Anesthesia Transfer of Care Note  Patient: Meegan Shanafelt  Procedure(s) Performed: HYSTERECTOMY, TOTAL, ROBOT-ASSISTED, LAPAROSCOPIC, WITH BILATERAL SALPINGO-OOPHORECTOMY/ PELVIC WASHING (Bilateral: Pelvis) CYSTOSCOPY (Bladder)  Patient Location: PACU  Anesthesia Type:General  Level of Consciousness: drowsy  Airway & Oxygen Therapy: Patient Spontanous Breathing and Patient connected to nasal cannula oxygen  Post-op Assessment: Report given to RN and Post -op Vital signs reviewed and stable  Post vital signs: Reviewed and stable  Last Vitals:  Vitals Value Taken Time  BP 83/45 01/08/24 16:35  Temp    Pulse 55 01/08/24 16:40  Resp 14 01/08/24 16:40  SpO2 95 % 01/08/24 16:40  Vitals shown include unfiled device data.  Last Pain:  Vitals:   01/08/24 1259  TempSrc: Oral  PainSc: 0-No pain      Patients Stated Pain Goal: 5 (01/08/24 1259)  Complications: No notable events documented.

## 2024-01-08 NOTE — Interval H&P Note (Signed)
 History and Physical Interval Note:  01/08/2024 2:01 PM  Angela West  has presented today for surgery, with the diagnosis of cyst of left ovary.  The various methods of treatment have been discussed with the patient and family. After consideration of risks, benefits and other options for treatment, the patient has consented to  Procedure(s): HYSTERECTOMY, TOTAL, ROBOT-ASSISTED, LAPAROSCOPIC, WITH BILATERAL SALPINGO-OOPHORECTOMY (Bilateral) CYSTOSCOPY (N/A) as a surgical intervention.  The patient's history has been reviewed, patient examined, no change in status, stable for surgery.  I have reviewed the patient's chart and labs.  Questions were answered to the patient's satisfaction.     [name]

## 2024-01-09 NOTE — Anesthesia Postprocedure Evaluation (Signed)
 Anesthesia Post Note  Patient: Angela West  Procedure(s) Performed: HYSTERECTOMY, TOTAL, ROBOT-ASSISTED, LAPAROSCOPIC, WITH BILATERAL SALPINGO-OOPHORECTOMY/ PELVIC WASHING (Bilateral: Pelvis) CYSTOSCOPY (Bladder)     Patient location during evaluation: PACU Anesthesia Type: General Level of consciousness: awake Pain management: pain level controlled Vital Signs Assessment: post-procedure vital signs reviewed and stable Respiratory status: spontaneous breathing, nonlabored ventilation and respiratory function stable Cardiovascular status: blood pressure returned to baseline and stable Postop Assessment: no apparent nausea or vomiting Anesthetic complications: no   No notable events documented.                Delon Aisha Arch

## 2024-01-10 ENCOUNTER — Encounter (HOSPITAL_COMMUNITY): Payer: Self-pay | Admitting: Obstetrics and Gynecology

## 2024-01-13 ENCOUNTER — Ambulatory Visit: Payer: Self-pay | Admitting: Obstetrics and Gynecology

## 2024-01-13 LAB — SURGICAL PATHOLOGY

## 2024-01-13 LAB — CYTOLOGY - NON PAP

## 2024-01-21 ENCOUNTER — Ambulatory Visit (INDEPENDENT_AMBULATORY_CARE_PROVIDER_SITE_OTHER): Admitting: Obstetrics and Gynecology

## 2024-01-21 VITALS — BP 122/82 | HR 83 | Wt 164.0 lb

## 2024-01-21 DIAGNOSIS — Z09 Encounter for follow-up examination after completed treatment for conditions other than malignant neoplasm: Secondary | ICD-10-CM

## 2024-01-21 NOTE — Progress Notes (Signed)
 Patient presents for 2 week postop from [facility], bilateral salpingectomy, cystoscopy. She is doing well. No fevers, VB, dysuria or severe abdominal pain.  BP 122/82 (BP Location: Left Arm, Patient Position: Sitting, Cuff Size: Large)   Pulse 83   Wt 164 lb (74.4 kg)   LMP 12/27/2010 (Exact Date)   SpO2 99%   BMI 32.03 kg/m   Abdomen: incisions I/c/d, NT, ND  A/p PO from [facility] 2 weeks doing well Encouraged no heavy lifting, pushing, pulling greater than 10 lbs for full 8 weeks 2. Pelvic rest until cleared 3. RTC with any concerns or with heavy bleeding, fevers or severe abdominal pain. And with 6,10 wks Dr. Glennon

## 2024-01-22 ENCOUNTER — Encounter: Admitting: Obstetrics and Gynecology

## 2024-02-19 ENCOUNTER — Encounter: Payer: Self-pay | Admitting: Obstetrics and Gynecology

## 2024-02-19 ENCOUNTER — Encounter: Admitting: Obstetrics and Gynecology

## 2024-02-19 VITALS — BP 126/84 | HR 67

## 2024-02-19 DIAGNOSIS — Z09 Encounter for follow-up examination after completed treatment for conditions other than malignant neoplasm: Secondary | ICD-10-CM

## 2024-02-19 MED ORDER — ESTRADIOL 0.01 % VA CREA: 1.0000 | TOPICAL_CREAM | Freq: Every day | VAGINAL | 3 refills | Status: AC

## 2024-02-19 NOTE — Progress Notes (Signed)
 Patient presents for 6 week postop from [facility], bilateral salpingectomy, cystoscopy. She is doing well. No fevers, VB, dysuria or severe abdominal pain.  BP 126/84 (BP Location: Right Arm, Patient Position: Sitting)   Pulse 67   LMP 12/27/2010   SpO2 99%  Sve: sutures seen, pink spotting seen from atrophic vaginitis Otherwise normal discharge  A/p PO from [facility] 2 weeks doing well Encouraged no heavy lifting, pushing, pulling greater than 13lb for 8 weeks. RTC with any concerns or with heavy bleeding, fevers or severe abdominal pain. 2. Begin intrarosa  at night and vaginal estrogen in AM until seen again to aid in cuff healing and atrophic vaginitis Dr. Glennon

## 2024-02-23 ENCOUNTER — Encounter: Payer: Self-pay | Admitting: Obstetrics and Gynecology

## 2024-03-17 ENCOUNTER — Ambulatory Visit (INDEPENDENT_AMBULATORY_CARE_PROVIDER_SITE_OTHER): Admitting: Obstetrics and Gynecology

## 2024-03-17 ENCOUNTER — Encounter: Payer: Self-pay | Admitting: Obstetrics and Gynecology

## 2024-03-17 VITALS — BP 126/84 | HR 60 | Ht 60.0 in | Wt 166.0 lb

## 2024-03-17 DIAGNOSIS — Z09 Encounter for follow-up examination after completed treatment for conditions other than malignant neoplasm: Secondary | ICD-10-CM

## 2024-03-17 NOTE — Progress Notes (Signed)
 Pt reports stabbing pains on & off post op.

## 2024-03-17 NOTE — Progress Notes (Signed)
" ° °  Acute Office Visit  Subjective:    Patient ID: Monserath Neff, female    DOB: 1955-06-28, 69 y.o.   MRN: [id number]   HPI 69 y.o. presents today for Post-op Follow-up (Pt reports stabbing pains on & off post op.) 10 week postop Alternating intrarosa  with vaginal estrogen. Light pink spotting. No fevers or heavy bleeding. Doing well.  Patient's last menstrual period was 12/27/2010.    Review of Systems     Objective:    OBGyn Exam  BP 126/84 (BP Location: Left Arm, Patient Position: Sitting, Cuff Size: Normal)   Pulse 60   Ht 5' (1.524 m)   Wt 166 lb (75.3 kg)   LMP 12/27/2010   SpO2 96%   BMI 32.42 kg/m  Wt Readings from Last 3 Encounters:  03/17/24 166 lb (75.3 kg)  01/21/24 164 lb (74.4 kg)  01/08/24 164 lb 1.6 oz (74.4 kg)       SVE: cuff intact with suture seen. Tissue much healthier without atrophic vaginitis Less tender with qtip palpation  Assessment & Plan:  10 wk PO RLH/BSO Finish estrogen cream and then continue with intrarosa  at bedtime from here on out. Continue pelvic rest. RTC in 4 weeks for exam or sooner with any concerns  Dr. Glennon Almarie MARLA Glennon  "

## 2024-03-22 ENCOUNTER — Telehealth: Payer: Self-pay

## 2024-03-22 DIAGNOSIS — Z9189 Other specified personal risk factors, not elsewhere classified: Secondary | ICD-10-CM

## 2024-03-22 NOTE — Telephone Encounter (Signed)
 PA done for intrarosa  6.5mg  inserts Key: [id number] Left message letting her know it could take several days for a response from her insurance company. DPR signed

## 2024-03-24 ENCOUNTER — Telehealth: Payer: Self-pay

## 2024-03-24 MED ORDER — INTRAROSA 6.5 MG VA INST: 1.0000 | VAGINAL_INSERT | Freq: Every evening | VAGINAL | 12 refills | Status: AC | PRN

## 2024-03-24 NOTE — Addendum Note (Signed)
 Addended by: Nohemy Koop P on: 03/24/2024 10:27 AM   Modules accepted: Orders

## 2024-03-24 NOTE — Telephone Encounter (Addendum)
 Intrarosa  6.5 mg vag insert Received Approval of Prescription Drug Coverage from [facility]. Authorization good until 03/02/25. Informed Patient.  Pt is asking that Rx be sent to: [facility] - [place], [place] - [address]   Also, Pt states she only has 4 left and is wondering if she should ration medication and use one every other day?  Please advise.

## 2024-03-24 NOTE — Telephone Encounter (Signed)
" °  Pt states she only has 4 of the: Vaginal suppository  left and is wondering if she should ration this medication and use one every other day?   Please advise. "

## 2024-04-07 ENCOUNTER — Ambulatory Visit: Admitting: Obstetrics and Gynecology

## 2024-04-14 ENCOUNTER — Ambulatory Visit: Admitting: Obstetrics and Gynecology

## 2024-09-28 ENCOUNTER — Encounter: Admitting: Obstetrics and Gynecology
